# Patient Record
Sex: Female | Born: 1970 | Race: White | Hispanic: No | Marital: Single | Smoking: Never smoker
Health system: Southern US, Community
[De-identification: ages and names within clinical notes are randomized; demographics above are authoritative.]

---

## 2021-03-26 ENCOUNTER — Inpatient Hospital Stay (HOSPITAL_COMMUNITY)
Admission: EM | Admit: 2021-03-26 | Discharge: 2021-04-05 | DRG: 066 | Disposition: A | Discharge status: Home / Self Care | Payer: BLUE CROSS/BLUE SHIELD | Attending: Student in an Organized Health Care Education/Training Program | Admitting: Student in an Organized Health Care Education/Training Program

## 2021-03-26 DIAGNOSIS — G4733 Obstructive sleep apnea (adult) (pediatric): Secondary | ICD-10-CM | POA: Diagnosis present

## 2021-03-26 DIAGNOSIS — R471 Dysarthria and anarthria: Secondary | ICD-10-CM | POA: Diagnosis present

## 2021-03-26 DIAGNOSIS — E876 Hypokalemia: Secondary | ICD-10-CM | POA: Diagnosis present

## 2021-03-26 DIAGNOSIS — R29810 Facial weakness: Secondary | ICD-10-CM | POA: Diagnosis present

## 2021-03-26 DIAGNOSIS — G8321 Monoplegia of upper limb affecting right dominant side: Secondary | ICD-10-CM | POA: Diagnosis present

## 2021-03-26 DIAGNOSIS — R81 Glycosuria: Secondary | ICD-10-CM | POA: Diagnosis present

## 2021-03-26 DIAGNOSIS — Z931 Gastrostomy status: Secondary | ICD-10-CM

## 2021-03-26 DIAGNOSIS — R262 Difficulty in walking, not elsewhere classified: Secondary | ICD-10-CM | POA: Diagnosis present

## 2021-03-26 DIAGNOSIS — R1314 Dysphagia, pharyngoesophageal phase: Secondary | ICD-10-CM | POA: Diagnosis present

## 2021-03-26 DIAGNOSIS — Z20822 Contact with and (suspected) exposure to covid-19: Secondary | ICD-10-CM | POA: Diagnosis present

## 2021-03-26 DIAGNOSIS — R1313 Dysphagia, pharyngeal phase: Secondary | ICD-10-CM | POA: Diagnosis present

## 2021-03-26 DIAGNOSIS — E669 Obesity, unspecified: Secondary | ICD-10-CM | POA: Diagnosis present

## 2021-03-26 DIAGNOSIS — Z6834 Body mass index (BMI) 34.0-34.9, adult: Secondary | ICD-10-CM

## 2021-03-26 DIAGNOSIS — I10 Essential (primary) hypertension: Secondary | ICD-10-CM | POA: Diagnosis present

## 2021-03-26 DIAGNOSIS — R29702 NIHSS score 2: Secondary | ICD-10-CM | POA: Diagnosis present

## 2021-03-26 DIAGNOSIS — R200 Anesthesia of skin: Secondary | ICD-10-CM

## 2021-03-26 DIAGNOSIS — I639 Cerebral infarction, unspecified: Principal | ICD-10-CM | POA: Diagnosis present

## 2021-03-26 DIAGNOSIS — R824 Acetonuria: Secondary | ICD-10-CM | POA: Diagnosis present

## 2021-03-26 DIAGNOSIS — K317 Polyp of stomach and duodenum: Secondary | ICD-10-CM | POA: Diagnosis present

## 2021-03-26 DIAGNOSIS — R600 Localized edema: Secondary | ICD-10-CM | POA: Diagnosis present

## 2021-03-26 DIAGNOSIS — Z4659 Encounter for fitting and adjustment of other gastrointestinal appliance and device: Secondary | ICD-10-CM

## 2021-03-26 NOTE — ED Triage Notes (Signed)
Pt from doctors office for follow up from food poisoning, brought to ED by [facility] for eval of generalized weakness, tingling in R arm & trouble swallowing for over 24hrs. No deficit noted by EMS, pt states "feels different." Ambulatory in triage  EKG WNL 166/105 CBG 101 99% RA

## 2021-03-27 ENCOUNTER — Emergency Department (HOSPITAL_COMMUNITY): Payer: BLUE CROSS/BLUE SHIELD

## 2021-03-27 ENCOUNTER — Inpatient Hospital Stay (HOSPITAL_COMMUNITY): Payer: BLUE CROSS/BLUE SHIELD

## 2021-03-27 DIAGNOSIS — G8321 Monoplegia of upper limb affecting right dominant side: Secondary | ICD-10-CM | POA: Diagnosis present

## 2021-03-27 DIAGNOSIS — Z20822 Contact with and (suspected) exposure to covid-19: Secondary | ICD-10-CM | POA: Diagnosis present

## 2021-03-27 DIAGNOSIS — R29702 NIHSS score 2: Secondary | ICD-10-CM | POA: Diagnosis present

## 2021-03-27 DIAGNOSIS — R1313 Dysphagia, pharyngeal phase: Secondary | ICD-10-CM | POA: Diagnosis present

## 2021-03-27 DIAGNOSIS — E876 Hypokalemia: Secondary | ICD-10-CM | POA: Diagnosis present

## 2021-03-27 DIAGNOSIS — R29810 Facial weakness: Secondary | ICD-10-CM | POA: Diagnosis present

## 2021-03-27 DIAGNOSIS — R81 Glycosuria: Secondary | ICD-10-CM | POA: Diagnosis present

## 2021-03-27 DIAGNOSIS — I639 Cerebral infarction, unspecified: Secondary | ICD-10-CM | POA: Diagnosis present

## 2021-03-27 DIAGNOSIS — Z6834 Body mass index (BMI) 34.0-34.9, adult: Secondary | ICD-10-CM | POA: Diagnosis not present

## 2021-03-27 DIAGNOSIS — I6389 Other cerebral infarction: Secondary | ICD-10-CM

## 2021-03-27 DIAGNOSIS — K317 Polyp of stomach and duodenum: Secondary | ICD-10-CM | POA: Diagnosis present

## 2021-03-27 DIAGNOSIS — E7800 Pure hypercholesterolemia, unspecified: Secondary | ICD-10-CM | POA: Diagnosis not present

## 2021-03-27 DIAGNOSIS — E669 Obesity, unspecified: Secondary | ICD-10-CM | POA: Diagnosis present

## 2021-03-27 DIAGNOSIS — R1314 Dysphagia, pharyngoesophageal phase: Secondary | ICD-10-CM | POA: Diagnosis present

## 2021-03-27 DIAGNOSIS — R600 Localized edema: Secondary | ICD-10-CM | POA: Diagnosis present

## 2021-03-27 DIAGNOSIS — R471 Dysarthria and anarthria: Secondary | ICD-10-CM

## 2021-03-27 DIAGNOSIS — R262 Difficulty in walking, not elsewhere classified: Secondary | ICD-10-CM | POA: Diagnosis present

## 2021-03-27 DIAGNOSIS — G4733 Obstructive sleep apnea (adult) (pediatric): Secondary | ICD-10-CM | POA: Diagnosis present

## 2021-03-27 DIAGNOSIS — I10 Essential (primary) hypertension: Secondary | ICD-10-CM | POA: Diagnosis present

## 2021-03-27 DIAGNOSIS — R824 Acetonuria: Secondary | ICD-10-CM | POA: Diagnosis present

## 2021-03-27 DIAGNOSIS — Z931 Gastrostomy status: Secondary | ICD-10-CM | POA: Diagnosis not present

## 2021-03-27 LAB — ECHOCARDIOGRAM COMPLETE
Area-P 1/2: 2.65 cm2
Height: 64 in
S' Lateral: 1.8 cm
Weight: 3248 oz

## 2021-03-27 LAB — CBC
HCT: 45.3 % (ref 36.0–46.0)
Hemoglobin: 14.3 g/dL (ref 12.0–15.0)
MCH: 26.2 pg (ref 26.0–34.0)
MCHC: 31.6 g/dL (ref 30.0–36.0)
MCV: 83 fL (ref 80.0–100.0)
Platelets: 329 10*3/uL (ref 150–400)
RBC: 5.46 MIL/uL — ABNORMAL HIGH (ref 3.87–5.11)
RDW: 14.7 % (ref 11.5–15.5)
WBC: 10.5 10*3/uL (ref 4.0–10.5)
nRBC: 0 % (ref 0.0–0.2)

## 2021-03-27 LAB — DIFFERENTIAL
Abs Immature Granulocytes: 0.02 10*3/uL (ref 0.00–0.07)
Basophils Absolute: 0.1 10*3/uL (ref 0.0–0.1)
Basophils Relative: 1 %
Eosinophils Absolute: 0 10*3/uL (ref 0.0–0.5)
Eosinophils Relative: 0 %
Immature Granulocytes: 0 %
Lymphocytes Relative: 21 %
Lymphs Abs: 2.2 10*3/uL (ref 0.7–4.0)
Monocytes Absolute: 0.7 10*3/uL (ref 0.1–1.0)
Monocytes Relative: 6 %
Neutro Abs: 7.5 10*3/uL (ref 1.7–7.7)
Neutrophils Relative %: 72 %

## 2021-03-27 LAB — I-STAT CHEM 8, ED
BUN: 22 mg/dL — ABNORMAL HIGH (ref 6–20)
Calcium, Ion: 1.18 mmol/L (ref 1.15–1.40)
Chloride: 103 mmol/L (ref 98–111)
Creatinine, Ser: 0.8 mg/dL (ref 0.44–1.00)
Glucose, Bld: 97 mg/dL (ref 70–99)
HCT: 46 % (ref 36.0–46.0)
Hemoglobin: 15.6 g/dL — ABNORMAL HIGH (ref 12.0–15.0)
Potassium: 3.2 mmol/L — ABNORMAL LOW (ref 3.5–5.1)
Sodium: 142 mmol/L (ref 135–145)
TCO2: 28 mmol/L (ref 22–32)

## 2021-03-27 LAB — URINALYSIS, ROUTINE W REFLEX MICROSCOPIC
Bilirubin Urine: NEGATIVE
Glucose, UA: 50 mg/dL — AB
Hgb urine dipstick: NEGATIVE
Ketones, ur: 20 mg/dL — AB
Leukocytes,Ua: NEGATIVE
Nitrite: NEGATIVE
Protein, ur: NEGATIVE mg/dL
Specific Gravity, Urine: 1.03 (ref 1.005–1.030)
pH: 6 (ref 5.0–8.0)

## 2021-03-27 LAB — COMPREHENSIVE METABOLIC PANEL
ALT: 20 U/L (ref 0–44)
AST: 26 U/L (ref 15–41)
Albumin: 4.6 g/dL (ref 3.5–5.0)
Alkaline Phosphatase: 75 U/L (ref 38–126)
Anion gap: 10 (ref 5–15)
BUN: 18 mg/dL (ref 6–20)
CO2: 26 mmol/L (ref 22–32)
Calcium: 9.4 mg/dL (ref 8.9–10.3)
Chloride: 103 mmol/L (ref 98–111)
Creatinine, Ser: 0.92 mg/dL (ref 0.44–1.00)
GFR, Estimated: >60 mL/min (ref >60)
Glucose, Bld: 97 mg/dL (ref 70–99)
Potassium: 3.2 mmol/L — ABNORMAL LOW (ref 3.5–5.1)
Sodium: 139 mmol/L (ref 135–145)
Total Bilirubin: 1.2 mg/dL (ref 0.3–1.2)
Total Protein: 7.8 g/dL (ref 6.5–8.1)

## 2021-03-27 LAB — ETHANOL: Alcohol, Ethyl (B): <10 mg/dL (ref <10)

## 2021-03-27 LAB — I-STAT BETA HCG BLOOD, ED (MC, WL, AP ONLY): I-stat hCG, quantitative: <5 m[IU]/mL (ref <5)

## 2021-03-27 LAB — TSH: TSH: 1.073 u[IU]/mL (ref 0.350–4.500)

## 2021-03-27 LAB — TROPONIN I (HIGH SENSITIVITY)
Troponin I (High Sensitivity): 10 ng/L (ref <18)
Troponin I (High Sensitivity): 9 ng/L (ref <18)

## 2021-03-27 LAB — PROTIME-INR
INR: 1 (ref 0.8–1.2)
Prothrombin Time: 13.5 seconds (ref 11.4–15.2)

## 2021-03-27 LAB — APTT: aPTT: 34 seconds (ref 24–36)

## 2021-03-27 LAB — SARS CORONAVIRUS 2 (TAT 6-24 HRS): SARS Coronavirus 2: NEGATIVE

## 2021-03-27 LAB — HIV ANTIBODY (ROUTINE TESTING W REFLEX): HIV Screen 4th Generation wRfx: NONREACTIVE

## 2021-03-27 IMAGING — MR MR HEAD WO/W CM
6 of 12 series · 26 of 48 positions shown · IV contrast (gadavist)
Comparison: None.

CLINICAL DATA: Right-sided tingling, difficulty speaking, abnormal
vision

EXAM:
MRI HEAD WITHOUT AND WITH CONTRAST
TECHNIQUE: Multiplanar, multiecho pulse sequences of the brain and surrounding
structures were obtained without and with intravenous contrast.
CONTRAST:  9mL GADAVIST GADOBUTROL 1 MMOL/ML IV SOLN

[Series 2: DWI · axial · 3.0mm · 0.94mm/px · z∈[-63,+82]mm · 8 of 99 slices shown (1 of 2)]
[im 1/99]
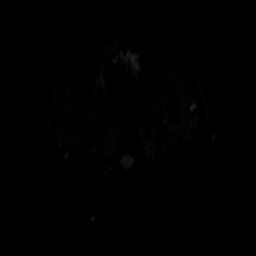
[im 15/99]
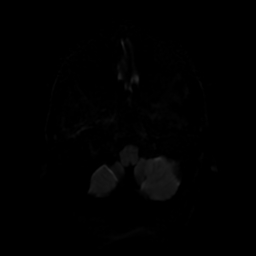
[im 29/99]
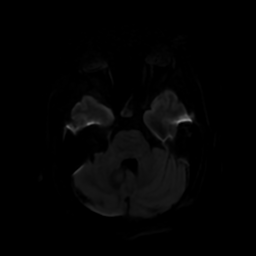
[im 43/99]
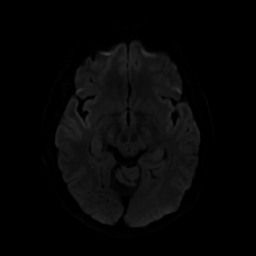
[im 57/99]
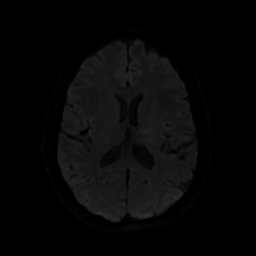
[im 71/99]
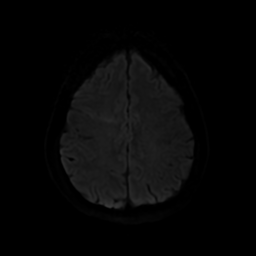
[im 85/99]
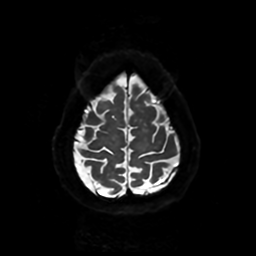
[im 99/99]
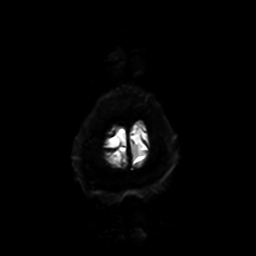

[Series 3: DWI · coronal · 4.0mm · 0.94mm/px · 6 of 74 slices shown (2 of 2)]
[im 1/74]
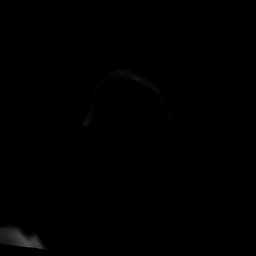
[im 15/74]
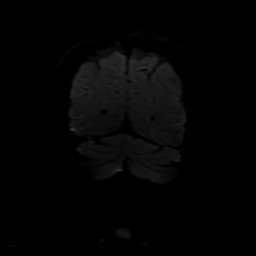
[im 30/74]
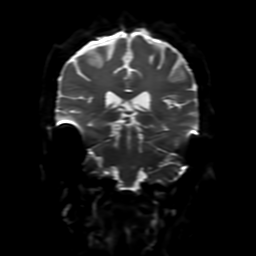
[im 44/74]
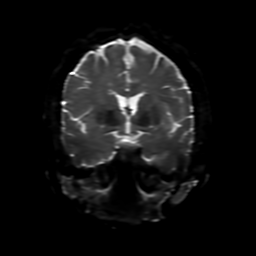
[im 59/74]
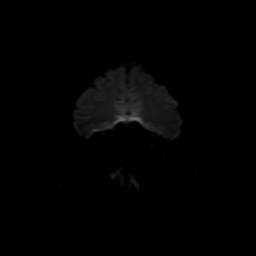
[im 74/74]
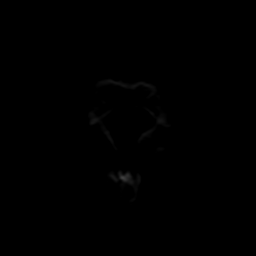

[Series 4: FLAIR · sagittal · 5.0mm · 0.23mm/px · 2 of 23 slices shown (1 of 2)]
[im 1/23]
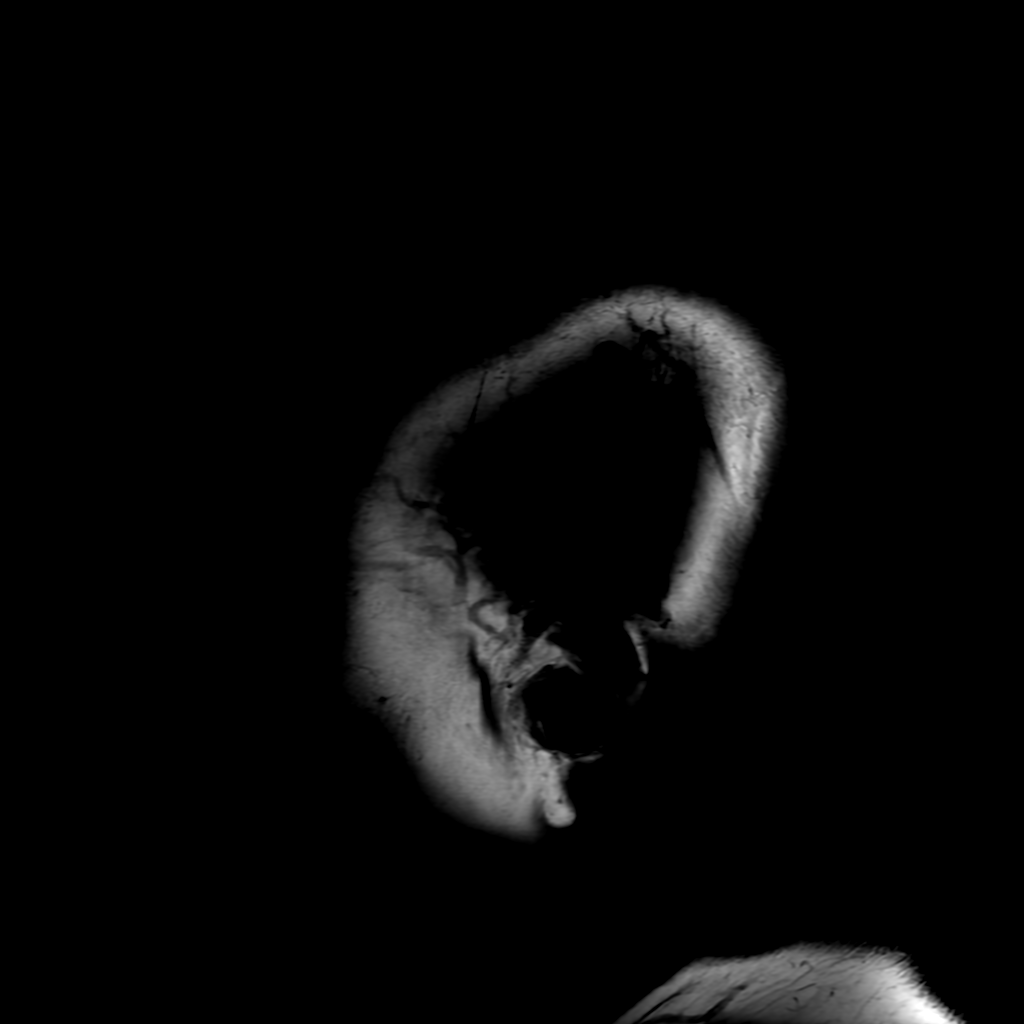
[im 23/23]
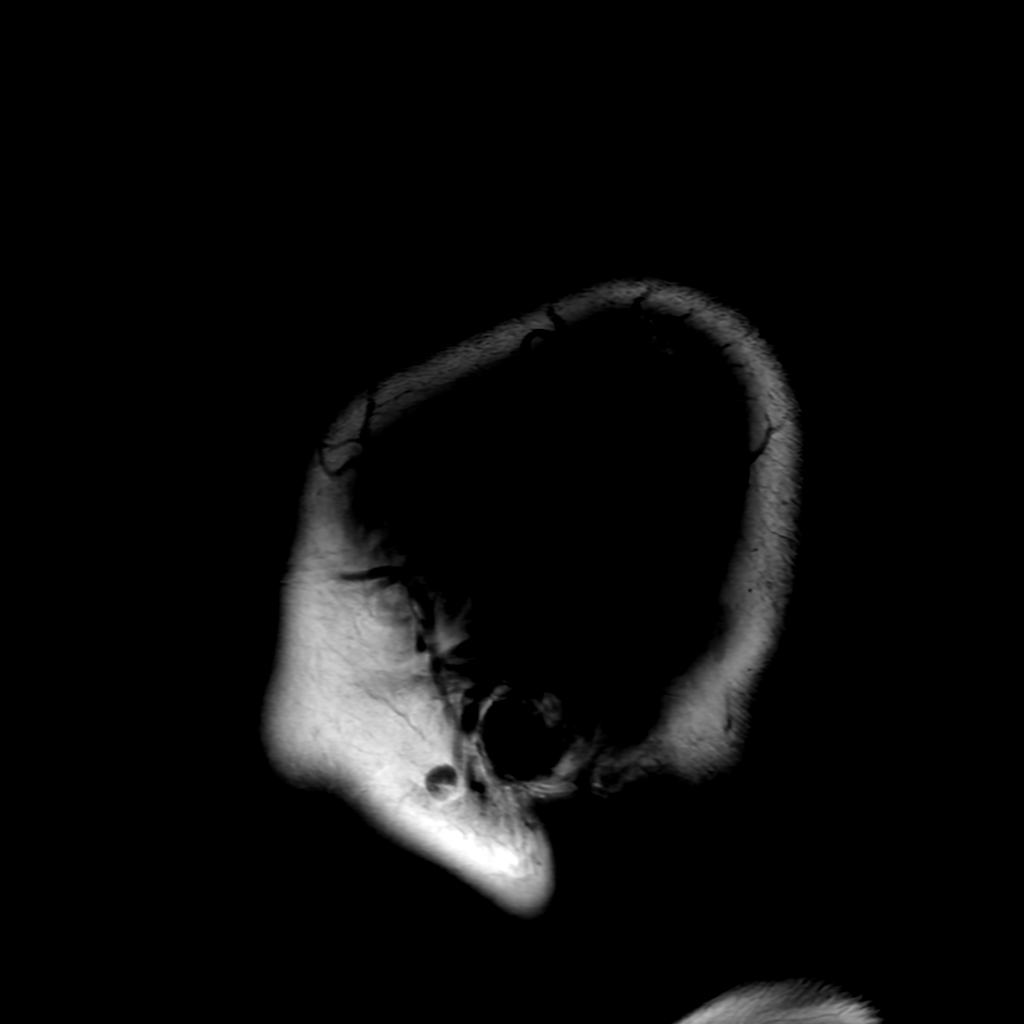

[Series 6: FLAIR · axial · 4.0mm · 0.45mm/px · z∈[-56,+83]mm · 3 of 33 slices shown (2 of 2)]
[im 1/33]
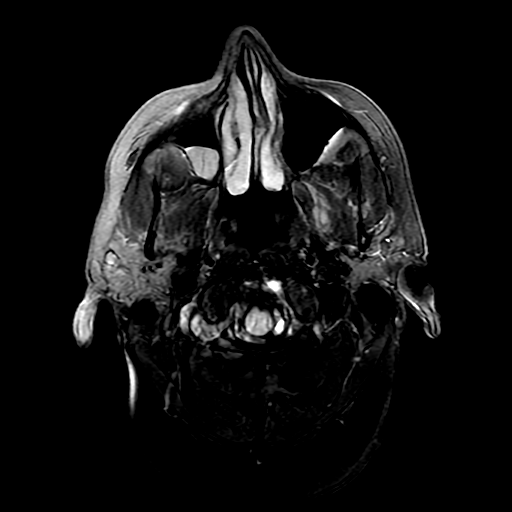
[im 17/33]
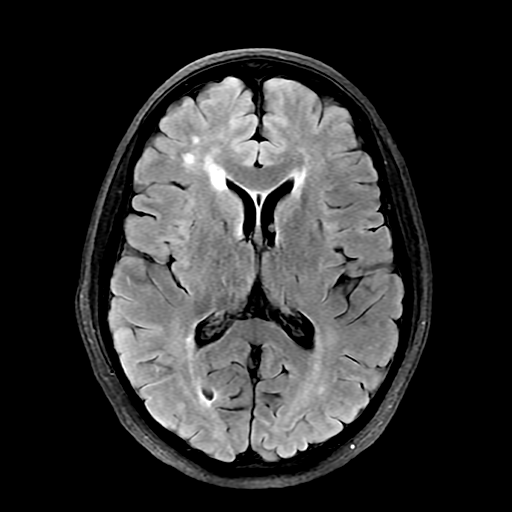
[im 33/33]
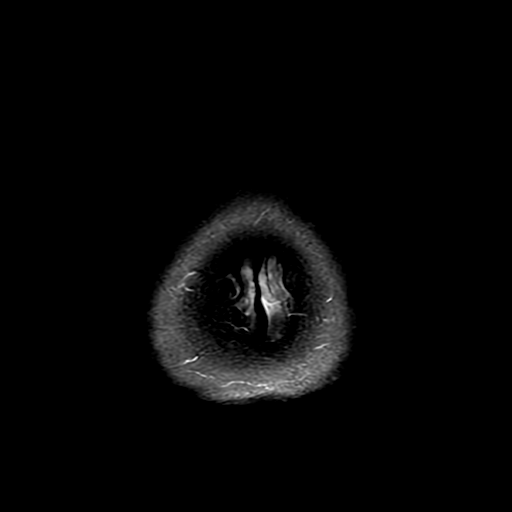

[Series 250: ADC · axial · 3.0mm · 0.94mm/px · z∈[-63,+82]mm · 4 of 50 slices shown (1 of 2)]
[im 1/50]
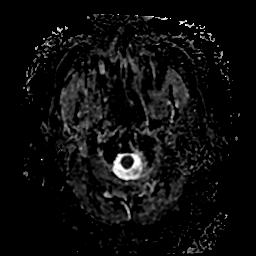
[im 17/50]
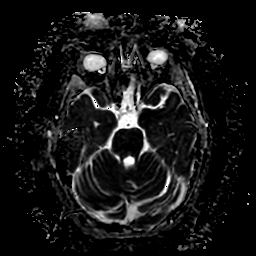
[im 33/50]
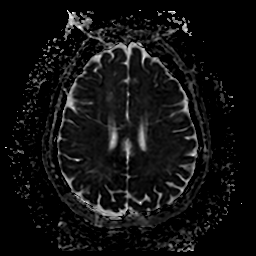
[im 50/50]
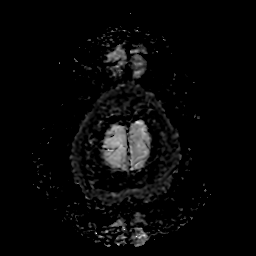

[Series 350: ADC · coronal · 4.0mm · 0.94mm/px · 3 of 37 slices shown (2 of 2)]
[im 1/37]
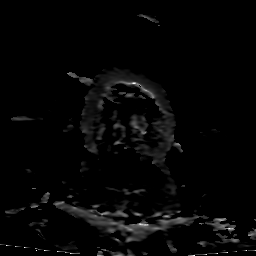
[im 19/37]
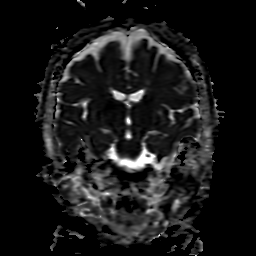
[im 37/37]
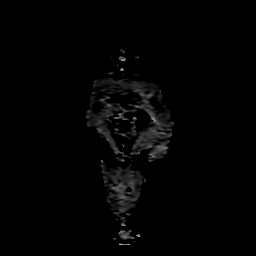

[26 of 48 positions shown; findings below may reference images not displayed]

FINDINGS: Brain: There is a subcentimeter area of mildly reduced diffusion at
the left aspect of the pontomedullary junction. There is no acute
infarction or intracranial hemorrhage. There is no intracranial
mass, mass effect, or edema. There is no hydrocephalus or
extra-axial fluid collection. Ventricles and sulci are normal in
size and configuration. Patchy T2 hyperintensity in the
supratentorial white matter is nonspecific but may reflect mild to
moderate chronic microvascular ischemic changes. No abnormal
enhancement.

Vascular: Major vessel flow voids at the skull base are preserved.

Skull and upper cervical spine: Normal marrow signal is preserved.

Sinuses/Orbits: Minor mucosal thickening.  Orbits are unremarkable.

Other: Sella is unremarkable.  Mastoid air cells are clear.
IMPRESSION: Small acute infarct at the left pontomedullary junction.

Mild to moderate chronic microvascular ischemic changes.

No mass or abnormal enhancement.

## 2021-03-27 IMAGING — MR MR MRA HEAD W/O CM
4 series · 18 of 48 positions shown · non-contrast
Comparison: No pertinent prior exam.

CLINICAL DATA: Weakness and right arm tingling.  Speech difficulty.

EXAM:
MRA HEAD WITHOUT CONTRAST
TECHNIQUE: Angiographic images of the Circle of Willis were acquired using MRA
technique without intravenous contrast.

[Series 2: ax 3dtof · axial · 1.0mm · 0.43mm/px · z∈[-63,+16]mm · 11 of 173 slices shown]
[im 9/173]
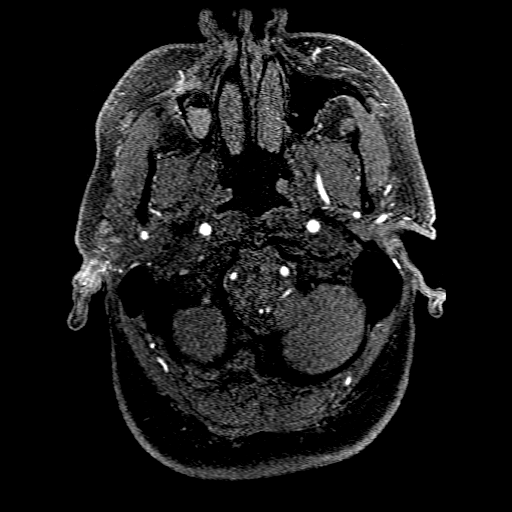
[im 26/173]
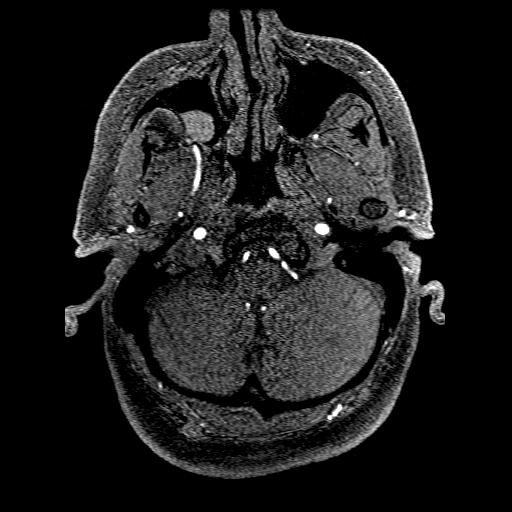
[im 31/173]
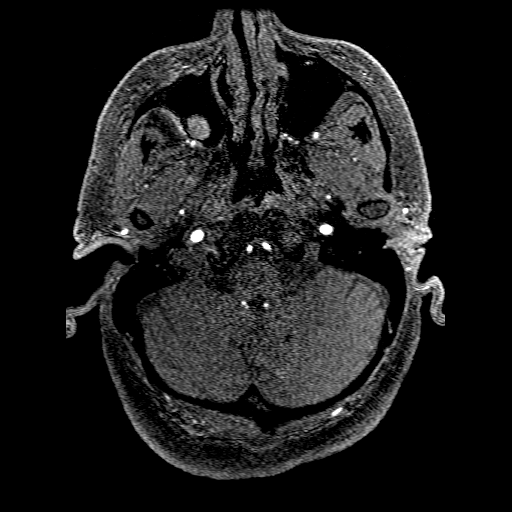
[im 52/173]
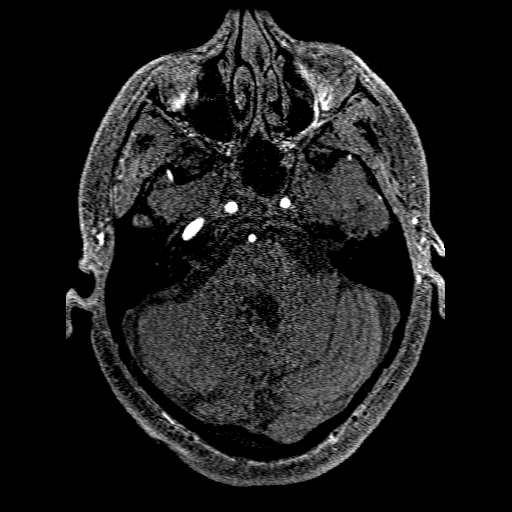
[im 74/173]
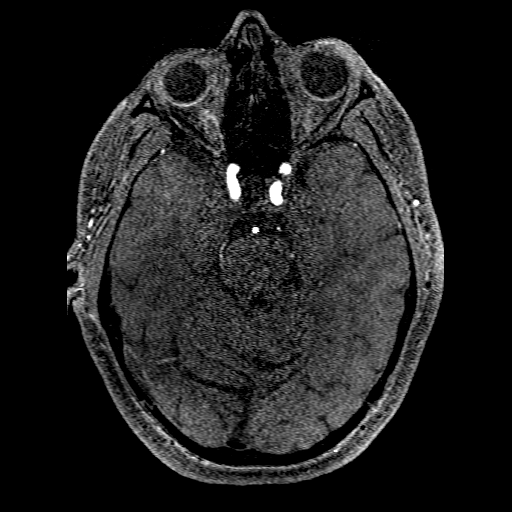
[im 87/173]
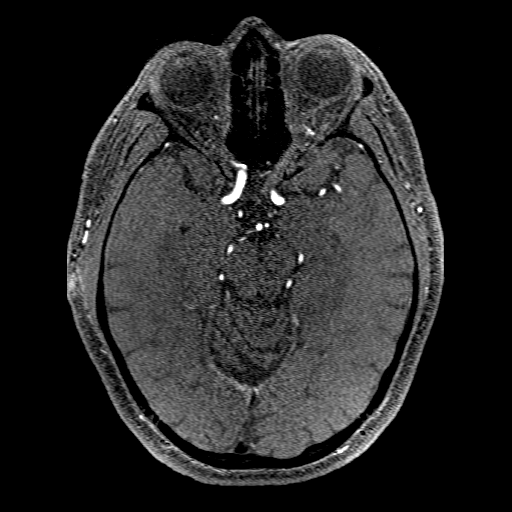
[im 99/173]
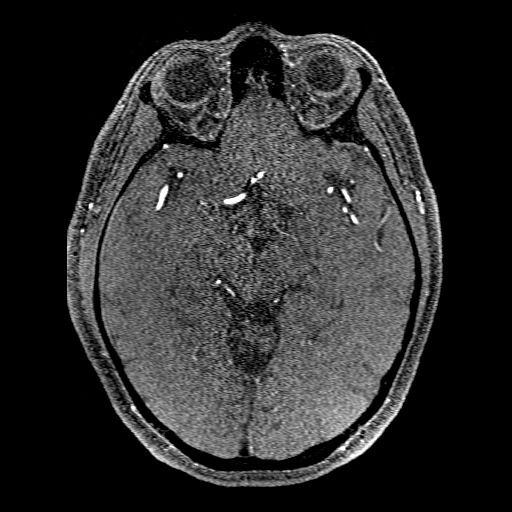
[im 121/173]
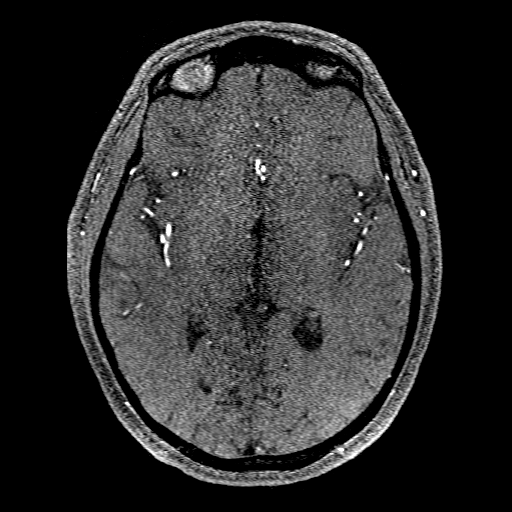
[im 142/173]
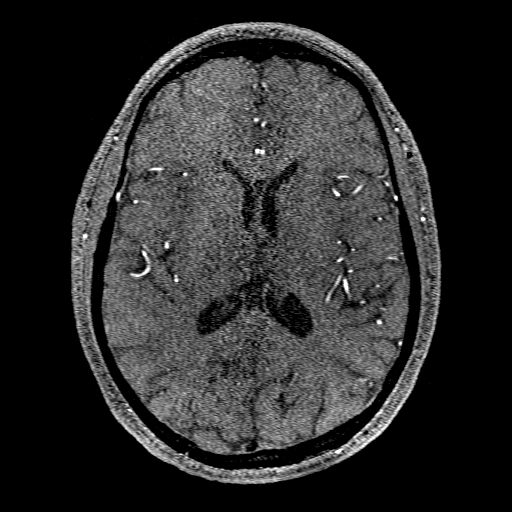
[im 147/173]
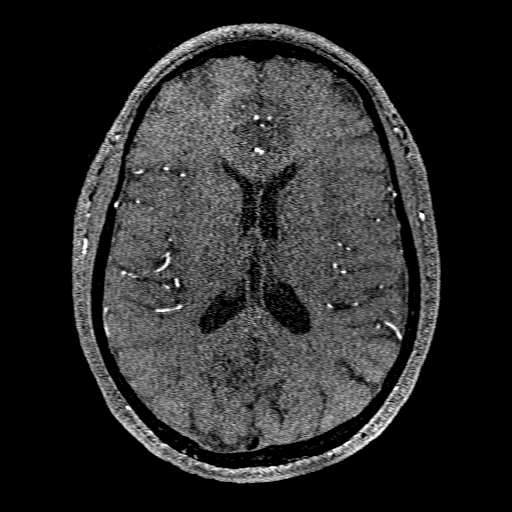
[im 164/173]
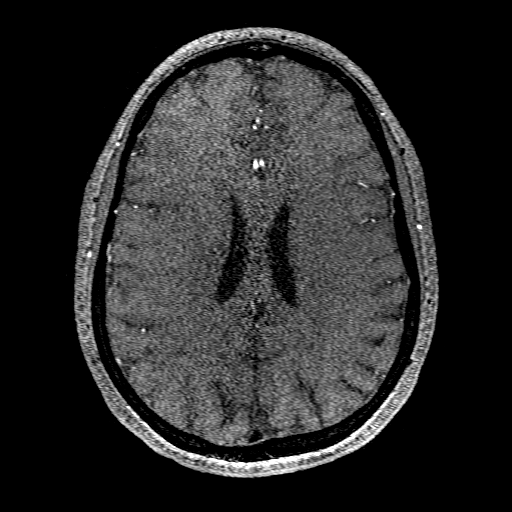

[Series 201: pjn:ax 3dtof · sagittal · 1.0mm · 0.43mm/px · 1 of 3 slices shown]
[im 1/3]
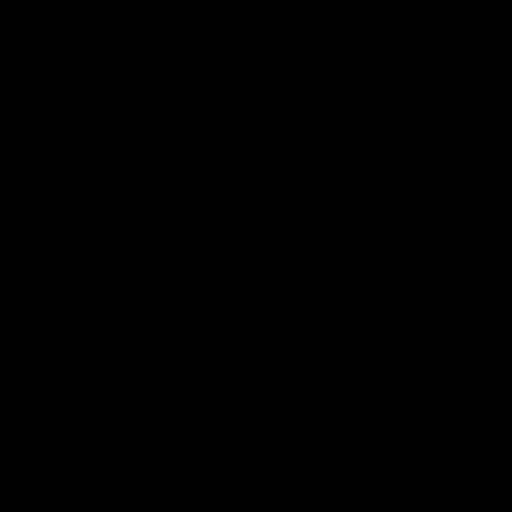

[Series 250: projection images · coronal · 1.0mm · 0.39mm/px · 3 of 13 slices shown (1 of 2)]
[im 1/13]
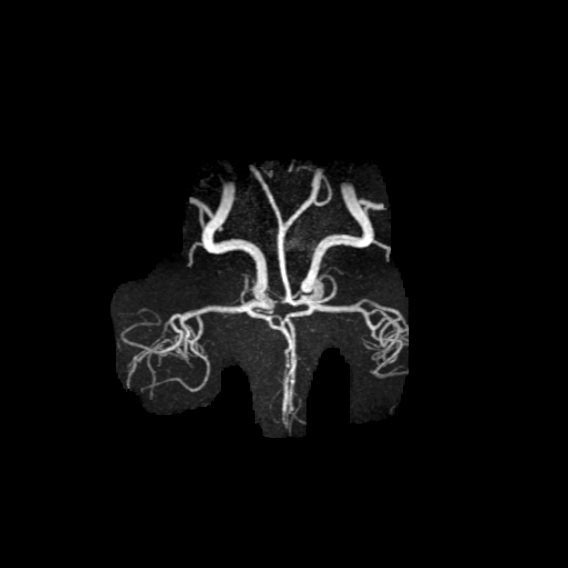
[im 7/13]
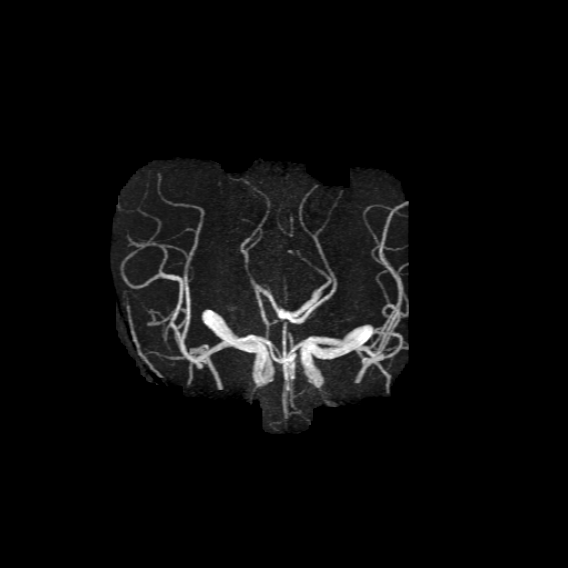
[im 13/13]
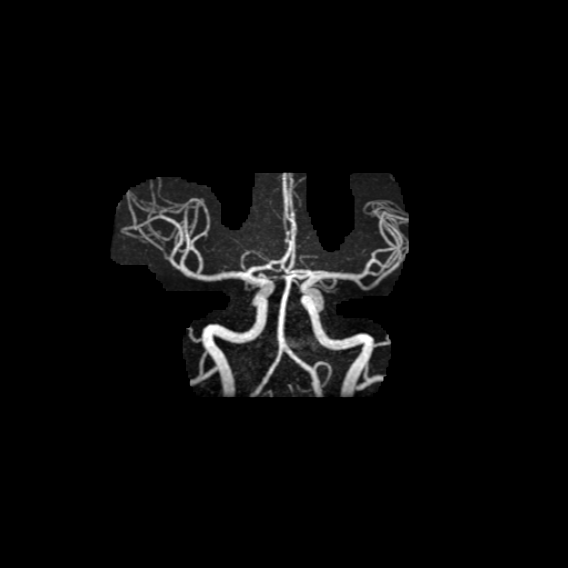

[Series 251: projection images · oblique · 1.0mm · 0.39mm/px · 3 of 13 slices shown (2 of 2)]
[im 1/13]
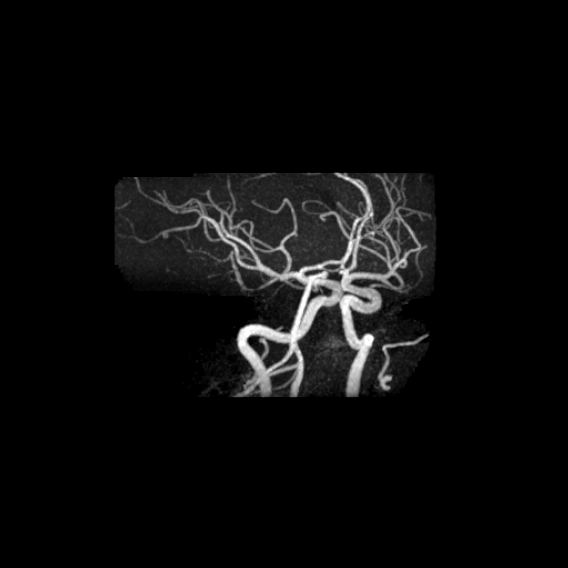
[im 7/13]
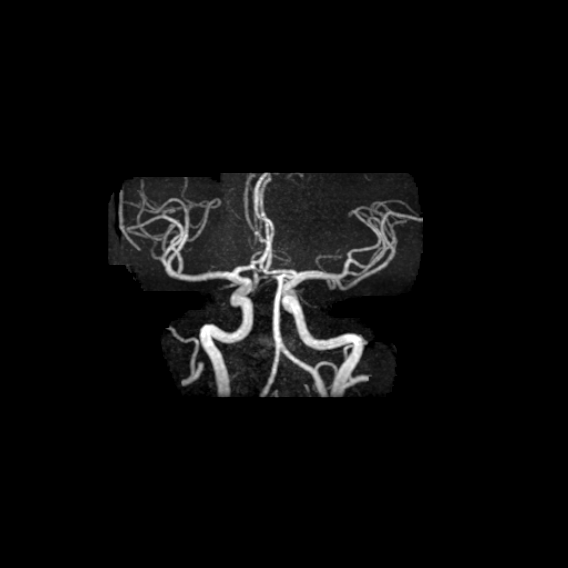
[im 13/13]
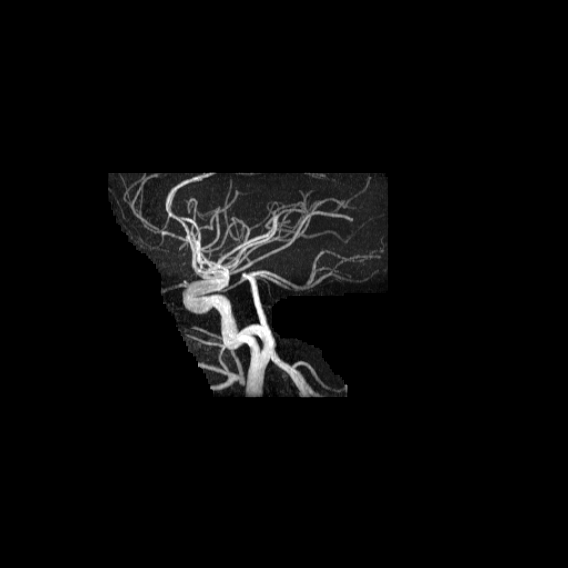

[18 of 48 positions shown; findings below may reference images not displayed]

FINDINGS: POSTERIOR CIRCULATION:

--Vertebral arteries: Normal

--Inferior cerebellar arteries: Normal.

--Basilar artery: Normal.

--Superior cerebellar arteries: Normal.

--Posterior cerebral arteries: Normal.

ANTERIOR CIRCULATION:

--Intracranial internal carotid arteries: Normal.

--Anterior cerebral arteries (ACA): Normal.

--Middle cerebral arteries (MCA): Normal.

ANATOMIC VARIANTS: None

Other: None.
IMPRESSION: Normal intracranial MRA.

## 2021-03-27 IMAGING — CT CT HEAD W/O CM
4 of 5 series · 16 of 47 positions shown, 18 images · non-contrast
Comparison: None.

CLINICAL DATA: 49-year-old female with generalized weakness.
Trouble swallowing for 24 hours. Right arm tingling and weakness.

EXAM:
CT HEAD WITHOUT CONTRAST
TECHNIQUE: Contiguous axial images were obtained from the base of the skull
through the vertex without intravenous contrast.

[Series 3: head wo · axial · 0.43mm/px · z∈[-120,-20]mm · 5 of 32 slices shown, 7 images (1 of 2)]
[im 6/32  brain]
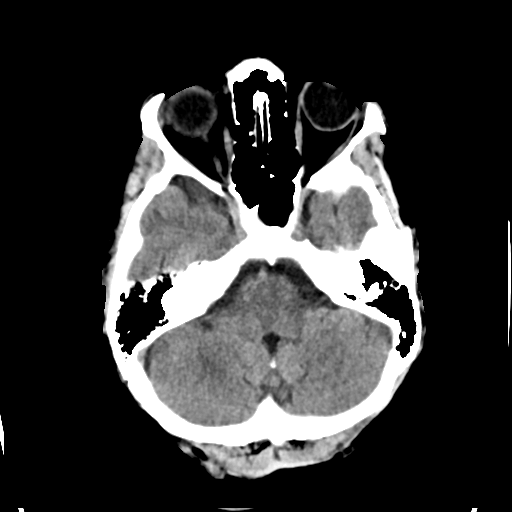
[im 6/32  bone]
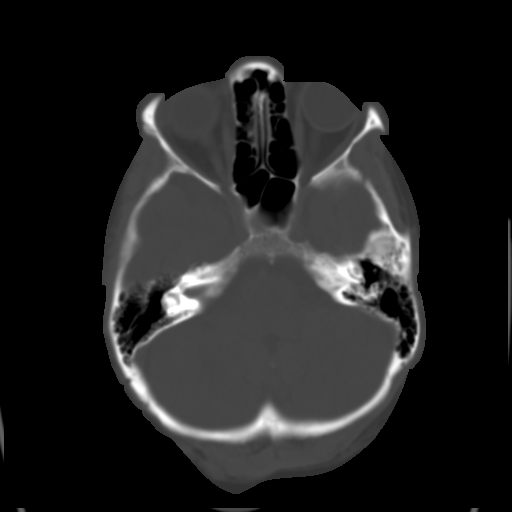
[im 11/32  brain]
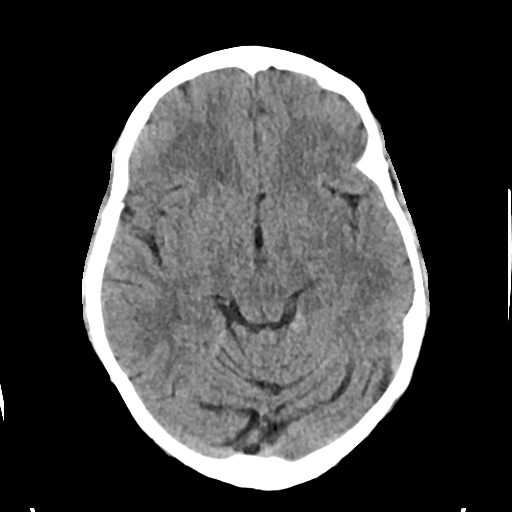
[im 16/32  brain]
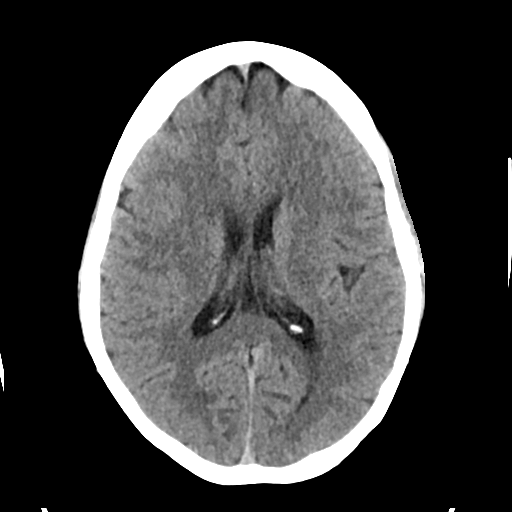
[im 21/32  brain]
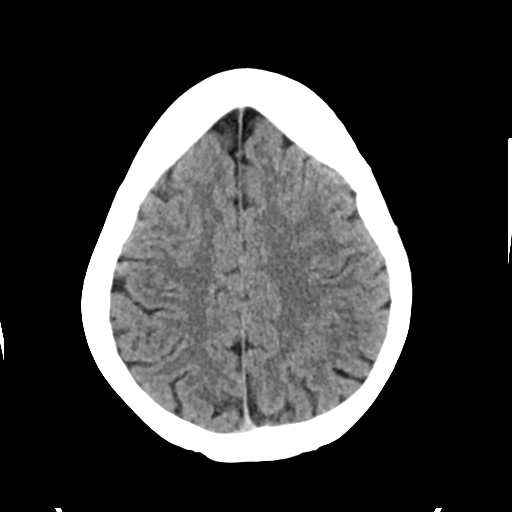
[im 26/32  brain]
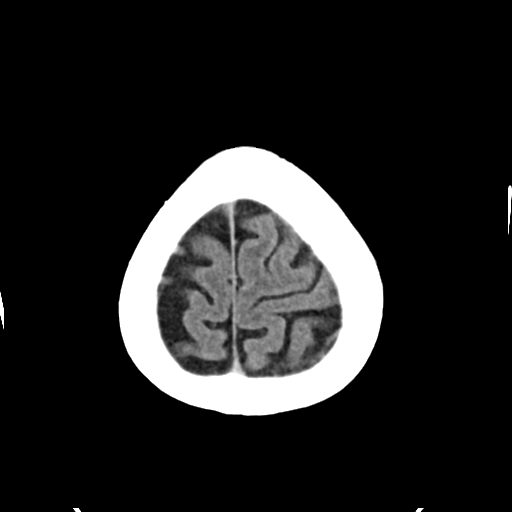
[im 26/32  bone]
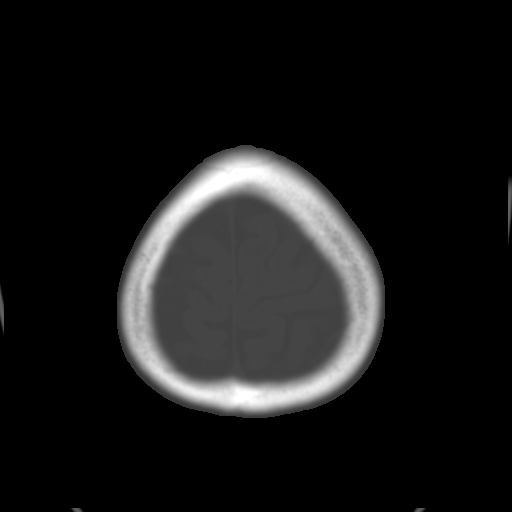

[Series 5: cor soft · coronal · 0.31mm/px · 3 of 68 slices shown]
[im 23/68  brain]
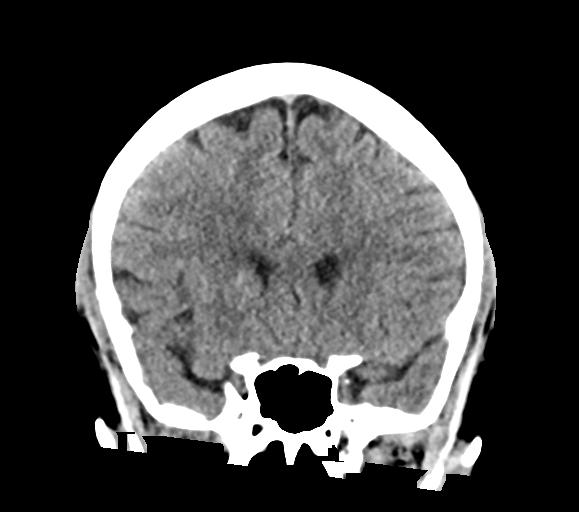
[im 30/68  brain]
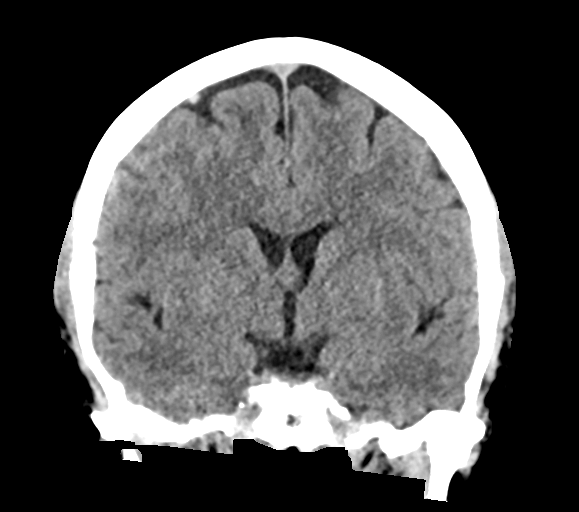
[im 38/68  brain]
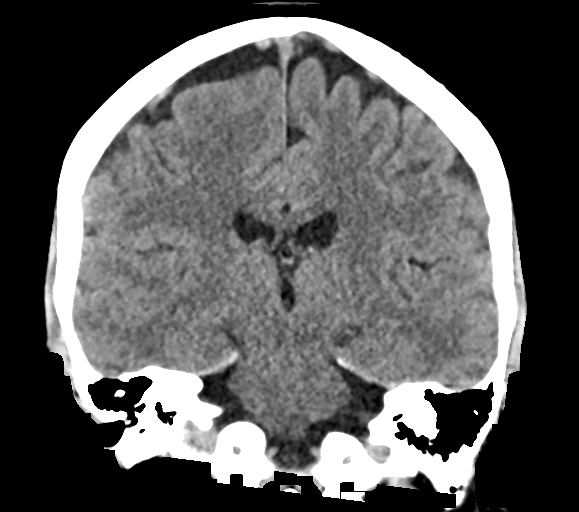

[Series 6: sag soft · sagittal · 0.31mm/px · 3 of 60 slices shown]
[im 21/60  brain]
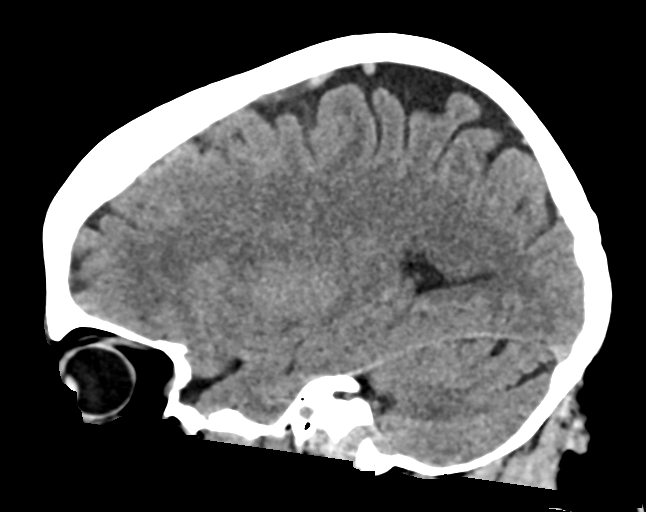
[im 30/60  brain]
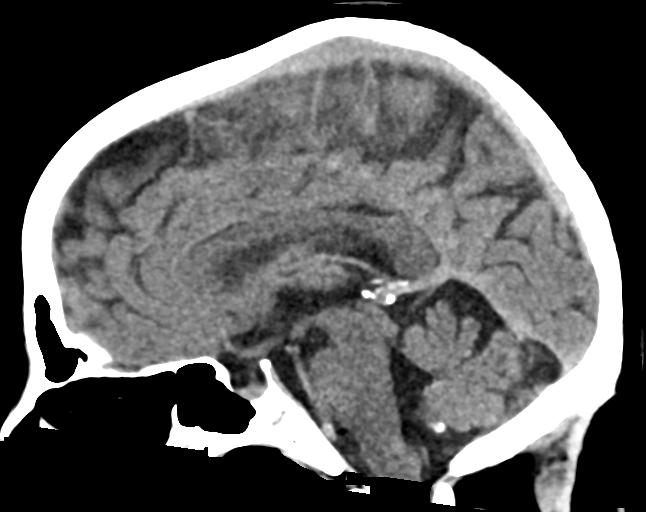
[im 38/60  brain]
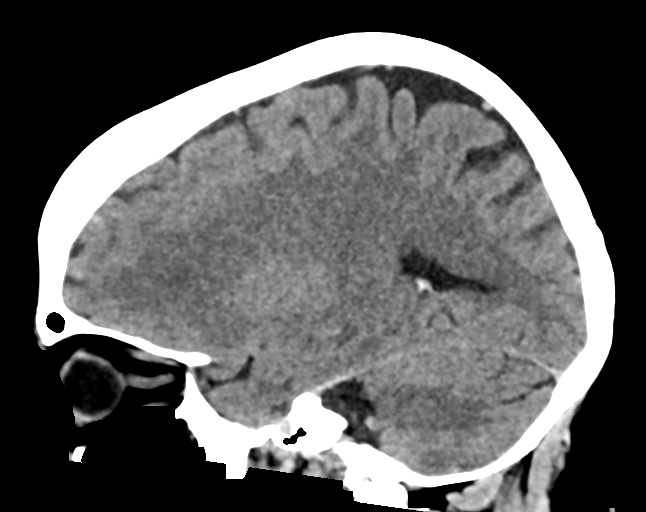

[Series 7: head wo · axial · 0.36mm/px · z∈[-153,-50]mm · 5 of 33 slices shown (2 of 2)]
[im 6/33  brain]
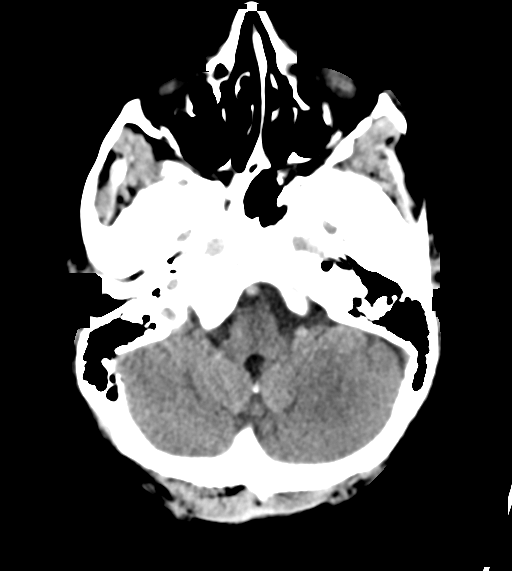
[im 11/33  brain]
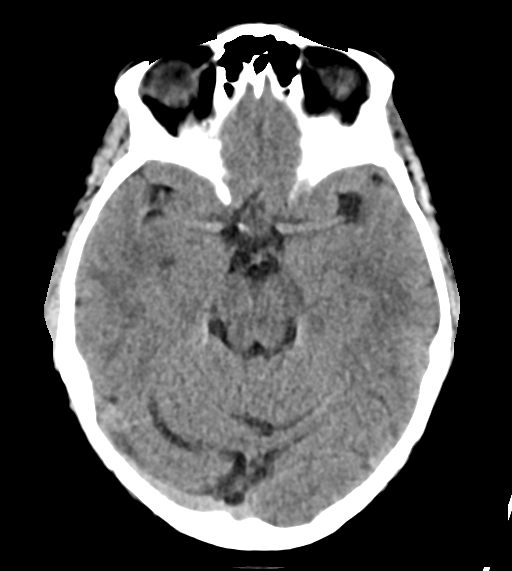
[im 17/33  brain]
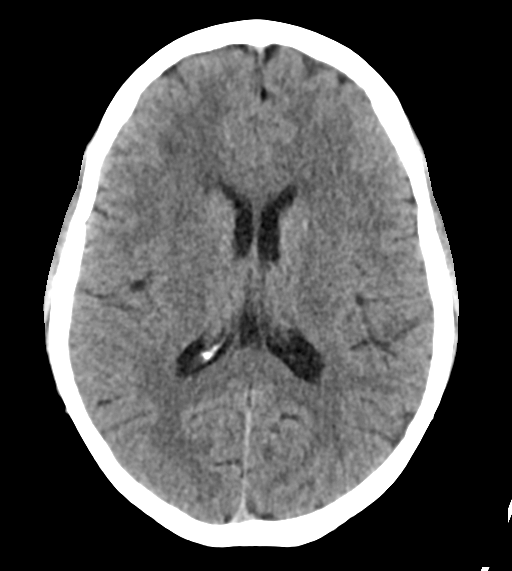
[im 22/33  brain]
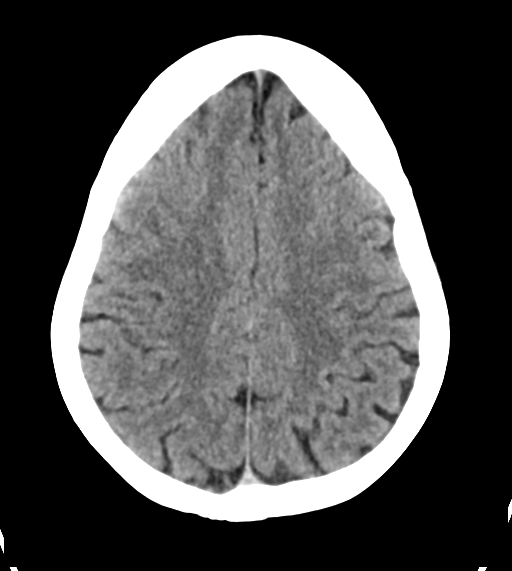
[im 27/33  brain]
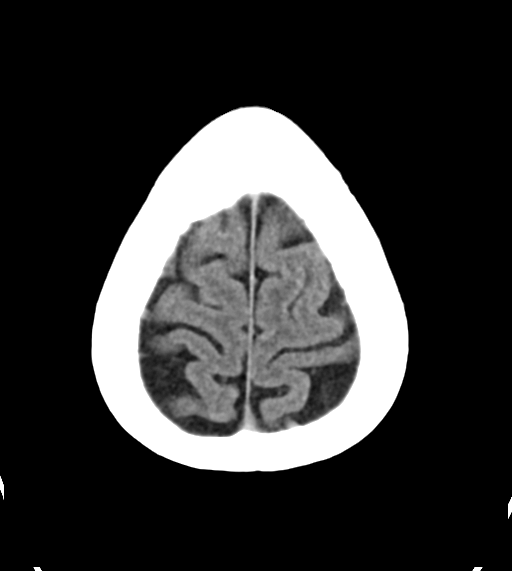

[16 of 47 positions shown; findings below may reference images not displayed]

FINDINGS: Brain: No midline shift, ventriculomegaly, mass effect, evidence of
mass lesion, intracranial hemorrhage or evidence of cortically based
acute infarction. Gray-white matter differentiation is within normal
limits throughout the brain. No encephalomalacia identified. Small
volume subarachnoid CSF over both convexities.

Vascular: Mild Calcified atherosclerosis at the skull base. No
suspicious intracranial vascular hyperdensity.

Skull: Negative.

Sinuses/Orbits: Visualized paranasal sinuses and mastoids are clear.

Other: Visualized orbits and scalp soft tissues are within normal
limits.
IMPRESSION: Negative noncontrast CT appearance of the brain.

## 2021-03-27 MED ORDER — SODIUM CHLORIDE 0.9 % IV SOLN: INTRAVENOUS | Status: AC

## 2021-03-27 MED ORDER — POTASSIUM CHLORIDE CRYS ER 20 MEQ PO TBCR: 40.0000 meq | EXTENDED_RELEASE_TABLET | Freq: Once | ORAL | Status: DC

## 2021-03-27 MED ORDER — ATORVASTATIN CALCIUM 40 MG PO TABS: 40.0000 mg | ORAL_TABLET | Freq: Every day | ORAL | Status: DC

## 2021-03-27 MED ORDER — SENNOSIDES-DOCUSATE SODIUM 8.6-50 MG PO TABS: 1.0000 | ORAL_TABLET | Freq: Every evening | ORAL | Status: DC | PRN

## 2021-03-27 MED ORDER — ASPIRIN EC 81 MG PO TBEC: 81.0000 mg | DELAYED_RELEASE_TABLET | Freq: Every day | ORAL | Status: DC

## 2021-03-27 MED ORDER — ACETAMINOPHEN 325 MG PO TABS: 650.0000 mg | ORAL_TABLET | ORAL | Status: DC | PRN

## 2021-03-27 MED ORDER — GADOBUTROL 1 MMOL/ML IV SOLN
9.0000 mL | Freq: Once | INTRAVENOUS | Status: AC | PRN
  Administered 2021-03-27: 9 mL via INTRAVENOUS

## 2021-03-27 MED ORDER — ACETAMINOPHEN 650 MG RE SUPP: 650.0000 mg | RECTAL | Status: DC | PRN

## 2021-03-27 MED ORDER — ENOXAPARIN SODIUM 40 MG/0.4ML IJ SOSY
40.0000 mg | PREFILLED_SYRINGE | INTRAMUSCULAR | Status: DC
  Administered 2021-03-27: 40 mg via SUBCUTANEOUS
  Filled 2021-03-27 (×2): qty 0.4

## 2021-03-27 MED ORDER — ACETAMINOPHEN 160 MG/5ML PO SOLN
650.0000 mg | ORAL | Status: DC | PRN
Start: 2021-03-27 — End: 2021-04-05
  Administered 2021-04-03: 650 mg
  Filled 2021-03-27: qty 20.3

## 2021-03-27 MED ORDER — STROKE: EARLY STAGES OF RECOVERY BOOK: Freq: Once | Status: DC

## 2021-03-27 MED ORDER — ASPIRIN 325 MG PO TABS: 325.0000 mg | ORAL_TABLET | Freq: Once | ORAL | Status: DC

## 2021-03-27 NOTE — ED Provider Notes (Signed)
[facility] EMERGENCY DEPARTMENT Provider Note   CSN: [id number] Arrival date & time: 03/26/21  1540     History Chief Complaint  Patient presents with   Weakness    Tanya Nichols is a 50 y.o. female.  Patient with no regular medical history here with generalized weakness, right-sided tingling, difficulty speaking, difficulty swallowing, dizziness and problems with her vision.  Symptoms have been ongoing since 10 AM June 15 with her last seen normal.  States she feels like there is "something off with my vision" and her vision is slanted.  She thought she could have food poisoning as she had multiple episodes of nausea and vomiting but no diarrhea.  Has had some dizziness and lightheadedness and difficulty walking.  Yesterday she developed right-sided numbness involving her face, arm and leg.  No weakness.  Feels like her mouth is not moving properly when she tries to get her words out.  Having difficulty swallowing and feels like she cannot swallow completely.  Feels like something is not going down when she tries to swallow.  She denies any weakness in her arms or legs.  She denies any chest pain or shortness of breath.  She denies any abdominal pain.  She denies any fever. She was seen in urgent care and referred to the ED with concern for stroke She has waited over 14 hours in the waiting room  The history is provided by the patient.  Weakness Associated symptoms: dizziness, nausea and vomiting   Associated symptoms: no abdominal pain, no arthralgias, no chest pain, no cough, no dysuria, no fever, no myalgias and no shortness of breath       No past medical history on file.  There are no problems to display for this patient.   * The histories are not reviewed yet. Please review them in the "History" navigator section and refresh this SmartLink.   OB History   No obstetric history on file.     No family history on file.     Home Medications Prior to  Admission medications   Not on File    Allergies    Patient has no allergy information on record.  Review of Systems   Review of Systems  Constitutional:  Negative for activity change, appetite change and fever.  HENT:  Negative for congestion and rhinorrhea.   Eyes:  Positive for visual disturbance.  Respiratory:  Negative for cough, chest tightness and shortness of breath.   Cardiovascular:  Negative for chest pain and leg swelling.  Gastrointestinal:  Positive for nausea and vomiting. Negative for abdominal pain.  Genitourinary:  Negative for dysuria and hematuria.  Musculoskeletal:  Negative for arthralgias and myalgias.  Skin:  Negative for rash.  Neurological:  Positive for dizziness, weakness, light-headedness and numbness.   all other systems are negative except as noted in the HPI and PMH.   Physical Exam Updated Vital Signs BP (!) 160/90   Pulse 75   Temp 98.7 F (37.1 C)   Resp 17   Ht 5\' 4"  (1.626 m)   Wt 92.1 kg   SpO2 100%   BMI 34.84 kg/m   Physical Exam Vitals and nursing note reviewed.  Constitutional:      General: She is not in acute distress.    Appearance: She is well-developed.  HENT:     Head: Normocephalic and atraumatic.     Mouth/Throat:     Pharynx: No oropharyngeal exudate.  Eyes:     Conjunctiva/sclera: Conjunctivae normal.  Pupils: Pupils are equal, round, and reactive to light.  Neck:     Comments: No meningismus. Cardiovascular:     Rate and Rhythm: Normal rate and regular rhythm.     Heart sounds: Normal heart sounds. No murmur heard. Pulmonary:     Effort: Pulmonary effort is normal. No respiratory distress.     Breath sounds: Normal breath sounds.  Chest:     Chest wall: No tenderness.  Abdominal:     Palpations: Abdomen is soft.     Tenderness: There is no abdominal tenderness. There is no guarding or rebound.  Musculoskeletal:        General: No tenderness. Normal range of motion.     Cervical back: Normal range of  motion and neck supple.     Comments: 5/5 strength in bilateral lower extremities. Ankle plantar and dorsiflexion intact. Great toe extension intact bilaterally. +2 DP and PT pulses. +2 patellar reflexes bilaterally. Normal gait.   Skin:    General: Skin is warm.  Neurological:     Mental Status: She is alert and oriented to person, place, and time.     Cranial Nerves: No cranial nerve deficit.     Motor: No abnormal muscle tone.     Coordination: Coordination normal.     Comments: No appreciable facial droop.  Tongue is midline.  Eyebrow raises equal. No ataxia on finger-to-nose.  5/5 strength throughout.  No pronator drift.  Subjective decreased sensation involving right face, arm and leg.  5/5 strength throughout. CN 2-12 intact.Equal grip strength.   Psychiatric:        Behavior: Behavior normal.    ED Results / Procedures / Treatments   Labs (all labs ordered are listed, but only abnormal results are displayed) Labs Reviewed  CBC - Abnormal; Notable for the following components:      Result Value   RBC 5.46 (*)    All other components within normal limits  COMPREHENSIVE METABOLIC PANEL - Abnormal; Notable for the following components:   Potassium 3.2 (*)    All other components within normal limits  I-STAT CHEM 8, ED - Abnormal; Notable for the following components:   Potassium 3.2 (*)    BUN 22 (*)    Hemoglobin 15.6 (*)    All other components within normal limits  ETHANOL  PROTIME-INR  APTT  DIFFERENTIAL  RAPID URINE DRUG SCREEN, HOSP PERFORMED  URINALYSIS, ROUTINE W REFLEX MICROSCOPIC  I-STAT BETA HCG BLOOD, ED ([facility], [facility], [facility] ONLY)  TROPONIN I (HIGH SENSITIVITY)  TROPONIN I (HIGH SENSITIVITY)    EKG EKG Interpretation  Date/Time:  Friday March 27 2021 05:36:35 EDT Ventricular Rate:  76 PR Interval:  155 QRS Duration: 106 QT Interval:  396 QTC Calculation: 446 R Axis:   70 Text Interpretation: Sinus rhythm No significant change was found Confirmed by  Glynn Octave [phone]) on 03/27/2021 5:44:48 AM  Radiology CT HEAD WO CONTRAST  Result Date: 03/27/2021 CLINICAL DATA:  50 year old female with generalized weakness. Trouble swallowing for 24 hours. Right arm tingling and weakness. EXAM: CT HEAD WITHOUT CONTRAST TECHNIQUE: Contiguous axial images were obtained from the base of the skull through the vertex without intravenous contrast. COMPARISON:  None. FINDINGS: Brain: No midline shift, ventriculomegaly, mass effect, evidence of mass lesion, intracranial hemorrhage or evidence of cortically based acute infarction. Gray-white matter differentiation is within normal limits throughout the brain. No encephalomalacia identified. Small volume subarachnoid CSF over both convexities. Vascular: Mild Calcified atherosclerosis at the skull base. No suspicious intracranial  vascular hyperdensity. Skull: Negative. Sinuses/Orbits: Visualized paranasal sinuses and mastoids are clear. Other: Visualized orbits and scalp soft tissues are within normal limits. IMPRESSION: Negative noncontrast CT appearance of the brain. Electronically Signed   By: Odessa Fleming M.D.   On: 03/27/2021 07:19    Procedures Procedures   Medications Ordered in ED Medications - No data to display  ED Course  I have reviewed the triage vital signs and the nursing notes.  Pertinent labs & imaging results that were available during my care of the patient were reviewed by me and considered in my medical decision making (see chart for details).    MDM Rules/Calculators/A&P                         Greater than 24 hours of right-sided numbness and 48 hours of visual problems with nausea, vomiting, dizziness, difficulty speaking, and difficulty swallowing.  Code stroke not activated due to delay in presentation  Potassium is low this will be replaced.  Patient is hypertensive.  We will allow permissive hypertension given her history of right-sided numbness.  CT scan is negative for acute  infarct or hemorrhage. Will plan for MRI given her right-sided numbness, difficulty speaking and difficulty swallowing.  May need neurology evaluation pending these results. Question possible CVA, TIA, myasthenia gravis, multiple sclerosis.  Care to be transferred to Dr. Madilyn Hook at shift change.  Final Clinical Impression(s) / ED Diagnoses Final diagnoses:  None    Rx / DC Orders ED Discharge Orders     None        Jalayna Josten, Jeannett Senior, MD 03/27/21 [phone]

## 2021-03-27 NOTE — ED Notes (Signed)
Echo at bedside

## 2021-03-27 NOTE — Progress Notes (Signed)
Carotid arterial duplex has been completed.  Results can be found under chart review under CV PROC. 03/27/2021 3:45 PM Beryle Zeitz RVT, RDMS

## 2021-03-27 NOTE — ED Provider Notes (Signed)
Patient care assumed at 0700. Patient here for evaluation of generalized weakness, tingling in right arm, difficulty swallowing that has been present for 24 to 48 hours. MRI pending.   MRI consistent with acute CVA. Neurology consulted. Medicine consulted for admission.   Tilden Fossa, MD 03/27/21 [phone]

## 2021-03-27 NOTE — Progress Notes (Signed)
Echocardiogram 2D Echocardiogram has been performed.  Warren Lacy Shallyn Constancio 03/27/2021, 1:08 PM

## 2021-03-27 NOTE — H&P (Addendum)
Date: 03/27/2021               Patient Name:  Tanya Nichols MRN: [id number]  DOB: 06/26/1971 Age / Sex: 50 y.o., female   PCP: Default, Provider, MD         Medical Service: Internal Medicine Teaching Service         Attending Physician: Dr. Oswaldo Done, Marquita Palms, *    First Contact: Dr. Austin Miles Pager: [phone]  Second Contact: Dr. Huel Cote Pager: [phone]       After Hours (After 5p/  First Contact Pager: [phone]  weekends / holidays): Second Contact Pager: [phone]   Chief Complaint: Facial droop  History of Present Illness:  Tanya Nichols is a 50 yo F w/ no known PMH presenting to [facility] with dysarthria, dysphagia, facial droop, nausea, vomiting. She was in her usual state of health until 03/25/21 at 10am when she developed acute onset vertigo with nausea and vomiting. She initially thought that she had food poisoning but she noticed right upper extremity tingling numbness as well as trouble speaking and swallowing. She mentions significant concern with medical costs and had hoped her symptoms would resolve on her own but eventually went to Urgent Care for evaluation and was transferred to [facility] for further work-up. She mentions that since being seen in Urgent Care and ED, her slurred speech, nausea and vomiting has resolved.  She mentions that she had not received any medical care for the last 20 years when she saw a doctor in [place]. She denies any prior history of diabetes, hyperlipidemia, hypertension or arrhythmia. She denies any prior history of MI or Stroke. Only medications she had taken in the past were some multi-vitamins.  On review of systems, she denies any fevers, chills, chest pain, palpitations, cough or shortness of breath. She did have episode of nausea and NBNB emesis but denies hematemesis or melena. She denies any diarrhea, constipation, dysuria, urinary frequency, or urinary urgency. She denies any head trauma, syncope, jerking movements. She denies any blurry  vision, headaches, or focal weakness.  Meds: None  Allergies: Allergies as of 03/26/2021   (Not on File)   No past medical history on file.  Family History: Aunt had variety of medical issues but no significant medical conditions in immediate family  Social History: Lives with parents. Has multiple dogs as pets. Works as Investment banker, operational. Denies any alcohol, tobacco, illicit substance use  Review of Systems: A complete ROS was negative except as per HPI.   Physical Exam: Blood pressure (!) 179/98, pulse 74, temperature 98.7 F (37.1 C), resp. rate 17, height 5\' 4"  (1.626 m), weight 92.1 kg, SpO2 100 %.  Gen: Well-developed, well nourished, NAD HEENT: NCAT head, hearing intact, EOMI, + L facial droop Neck: supple, ROM intact, no JVD CV: RRR, S1, S2 normal, No rubs, no murmurs, no gallops Pulm: CTAB, No rales, no wheezes Abd: Soft, BS+, NTND, No rebound, no guarding Extm: ROM intact, Peripheral pulses intact, no peripheral edema Skin: Dry, Warm, normal turgor Neurologic exam: Mental status: A&Ox3 Cranial Nerves:             II: PERRL             III, IV, VI: Extra-occular motions intact bilaterally             V, VII: + L facial droop, sensation intact in all 3 divisions               VIII: hearing normal to rubbing fingers  bilaterally               IX, X: palate rises symmetrically             XI: Head turn and shoulder shrug normal bilaterally               XII: tongue midline    Motor: Strength 5/5 on all upper and lower extremities, bulk muscle and tone are normal  Sensory: + paresthesia on RUE Coordination: There is no dysmetria on finger-to-nose. Rapid alternating movement test normal. Heel to shin test normal. Psychiatric: Normal mood and affect  EKG: personally reviewed my interpretation is normal sinus, no ischemic changes  CXR: N/A  Assessment & Plan by Problem: Active Problems:   L acute pontomedullary junction infarct  Tanya Nichols is a 50 yo F w/ no known PMH  presenting to [facility] with dysarthria, dysphagia, facial droop, paresthesia 2/2 CVA  Dysarthria, Dysphagia, RUE Paresthesia 2/2 L acute pontomedullary junction infarct Present w/ facial droop, trouble swallowing, slurred speech. CT head negative. MR showing acute infarct at pontomedullary junction. No prior screening for risk factors for stroke. Will require admission for further work-up and risk factor management, but patient express significant concern regarding financial barriers. - Neurology consult - Allow for permissive HTN in the setting of acute CVA (systolic < 220 and diastolic < 120) - ASA 325 mg / 81 mg daily  - Start atorvastatin 40mg  daily - Echocardiogram, Carotid doppler  - A1C, Lipid panel  - Tele monitoring  - SLP eval, PT/OT - Aspiration precautions  HTN Admit bp 169/95 although much more hypertensive during my evaluation. Will likely need oral anti-hypertensives after window for permissive hypertension - Monitor  DVT prophx: lovenox Diet: NPO til swallow screen Bowel: Senokot Code: Full  Prior to Admission Living Arrangement: HOme Anticipated Discharge Location: Home Barriers to Discharge: Medical work-up  Dispo: Admit patient to Observation with expected length of stay less than 2 midnights.  Signed: , MD 03/27/2021, 1:19 PM Pager: [phone] After 5pm on weekdays and 1pm on weekends: On Call Pager: [phone]

## 2021-03-27 NOTE — ED Notes (Signed)
Pt transported to MRI at this time 

## 2021-03-27 NOTE — Evaluation (Signed)
Clinical/Bedside Swallow Evaluation Patient Details  Name: Tanya Nichols MRN: [id number] Date of Birth: Mar 04, 1971  Today's Date: 03/27/2021 Time: SLP Start Time (ACUTE ONLY): 1451 SLP Stop Time (ACUTE ONLY): 1515 SLP Time Calculation (min) (ACUTE ONLY): 23.93 min  Past Medical History: No past medical history on file.  HPI:  50 yo F w/ no known PMH presenting to [facility] with dysarthria, dysphagia, facial droop, nausea, vomiting. She was in her usual state of health until 03/25/21 at 10am when she developed acute onset vertigo with nausea and vomiting. MRI showed acute infarct at left pontomedullary junction. Pt failed [employer] screen.   Assessment / Plan / Recommendation Clinical Impression  Pt presents with a significant pharyngeal dysphagia with deficits in CN V sensory function (diminished sensation temperature pathway right), CN VII motor (asymmetry left) at rest, and inability to trigger a pharyngeal swallow response.  Trials of ice chips, one teaspoon water, half tspn puree did not NOT elicit a complete swallow response, leading to expectoration of materials.  Continue NPO; SLP will f/u next date.  Hopefully there will be some spontaneous recovery next 24-48 hours - cortrak may be warranted.  MBS will be indicated as well. Discussed results/recs with Ms.Stockard, who verbalized understanding of plan and potential need for cortrak.  SLP will follow. D/W RN.  SLP Visit Diagnosis: Dysphagia, pharyngeal phase (R13.13)    Aspiration Risk       Diet Recommendation   NPO - allow pt to try sips and chips as able       Other  Recommendations Oral Care Recommendations: Oral care QID   Follow up Recommendations Other (comment) (tba)      Frequency and Duration min 3x week  1 week       Prognosis Prognosis for Safe Diet Advancement: Good      Swallow Study   General Date of Onset: 03/25/21 HPI: 50 yo F w/ no known PMH presenting to [facility] with dysarthria, dysphagia, facial droop,  nausea, vomiting. She was in her usual state of health until 03/25/21 at 10am when she developed acute onset vertigo with nausea and vomiting. MRI showed acute infarct at left pontomedullary junction. Pt failed [employer] screen. Type of Study: Bedside Swallow Evaluation Previous Swallow Assessment: no Diet Prior to this Study: NPO Temperature Spikes Noted: No Respiratory Status: Room air History of Recent Intubation: No Behavior/Cognition: Alert;Cooperative;Pleasant mood Oral Cavity Assessment: Within Functional Limits Oral Care Completed by SLP: No Oral Cavity - Dentition: Adequate natural dentition Vision: Functional for self-feeding Self-Feeding Abilities: Able to feed self Patient Positioning: Upright in bed Baseline Vocal Quality: Normal Volitional Cough: Strong Volitional Swallow: Unable to elicit    Oral/Motor/Sensory Function Overall Oral Motor/Sensory Function: Moderate impairment Facial ROM: Within Functional Limits Facial Symmetry: Suspected CN VII (facial) dysfunction;Abnormal symmetry left Facial Sensation: Reduced right;Suspected CN V (Trigeminal) dysfunction (diminished sensation of temperature on right) Lingual Symmetry: Abnormal symmetry right;Suspected CN XII (hypoglossal) dysfunction   Ice Chips Ice chips: Impaired Presentation: Self Fed Pharyngeal Phase Impairments: Unable to trigger swallow   Thin Liquid Thin Liquid: Impaired Pharyngeal  Phase Impairments: Unable to trigger swallow    Nectar Thick Nectar Thick Liquid: Not tested   Honey Thick Honey Thick Liquid: Not tested   Puree Puree: Impaired Pharyngeal Phase Impairments: Unable to trigger swallow   Solid     Solid: Not tested      Blenda Mounts Laurice 03/27/2021,3:30 PM Marchelle Folks L. Samson Frederic, MA CCC/SLP Acute Rehabilitation Services Office number [phone] Pager [phone]

## 2021-03-27 NOTE — Evaluation (Signed)
Physical Therapy Evaluation Patient Details Name: Tanya Nichols MRN: [id number] DOB: 12-03-1970 Today's Date: 03/27/2021   History of Present Illness  50 y.o. female presents to [facility] ED on 03/26/2021 with reports of nausea, vomiting, RUE numbness/tingling, dysphagia, and dysarthria. MRI demonstrates acute pontomedullary junction infarct. Pt reports insignificant PMH.  Clinical Impression  Pt presents to PT with deficits in sensation, gait, balance, and vestibular function. Pt reports R numbness, although this appears to have limited affects on mobility. Pt does demonstrate imbalance and reports dizziness with head turns when stationary and when ambulating. Vestibular assessment results documented in General comments. Pt will benefit from acute PT follow-up or outpatient PT services to aide in improving higher level balance and to provide further vestibular treatment.    Follow Up Recommendations Outpatient PT (preferably outpatient neuro with vestibular experience)    Equipment Recommendations  None recommended by PT    Recommendations for Other Services       Precautions / Restrictions Precautions Precautions: Fall Precaution Comments: horizontal nystagmus noted with L head turns Restrictions Weight Bearing Restrictions: No      Mobility  Bed Mobility Overal bed mobility: Independent                  Transfers Overall transfer level: Independent Equipment used: None                Ambulation/Gait Ambulation/Gait assistance: Supervision Gait Distance (Feet): 400 Feet Assistive device: None Gait Pattern/deviations: Step-through pattern Gait velocity: functional Gait velocity interpretation: >2.62 ft/sec, indicative of community ambulatory General Gait Details: pt with lateral drift and reports of dizziness with horizontal head turns when mobilizing. Pt is able to change gait speed, stop abruptly, change step length/height, and walk backward without  LOB  Stairs            Wheelchair Mobility    Modified Rankin (Stroke Patients Only) Modified Rankin (Stroke Patients Only) Pre-Morbid Rankin Score: No symptoms Modified Rankin: Slight disability     Balance Overall balance assessment: Needs assistance Sitting-balance support: No upper extremity supported;Feet supported Sitting balance-Leahy Scale: Good     Standing balance support: No upper extremity supported Standing balance-Leahy Scale: Good                               Pertinent Vitals/Pain Pain Assessment: No/denies pain    Home Living Family/patient expects to be discharged to:: Private residence Living Arrangements: Parent Available Help at Discharge: Family;Available 24 hours/day Type of Home: House Home Access: Stairs to enter Entrance Stairs-Rails: Can reach both Entrance Stairs-Number of Steps: 4 Home Layout: Multi-level Home Equipment: Grab bars - tub/shower      Prior Function Level of Independence: Independent         Comments: minimal driving reported, works as a Investment banker, operational, 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 6 days a week     Hand Dominance        Extremity/Trunk Assessment   Upper Extremity Assessment Upper Extremity Assessment: RUE deficits/detail RUE Sensation: decreased light touch    Lower Extremity Assessment Lower Extremity Assessment: RLE deficits/detail RLE Sensation: decreased light touch    Cervical / Trunk Assessment Cervical / Trunk Assessment: Normal  Communication   Communication: No difficulties  Cognition Arousal/Alertness: Awake/alert Behavior During Therapy: WFL for tasks assessed/performed Overall Cognitive Status: Within Functional Limits for tasks assessed  General Comments General comments (skin integrity, edema, etc.): VSS on RA. PT performs vestibular evaluation, pt is positive for horizontal nystagmus with VOR with leftward head turns,  reporting dizziness. Pt also symptomatic with horizontal head turns when ambulating, symptoms resolve quickly after cessation of turns. No other positive tests with vestibular exam    Exercises     Assessment/Plan    PT Assessment Patient needs continued PT services  PT Problem List Decreased balance;Decreased mobility;Impaired sensation       PT Treatment Interventions Gait training;Stair training;Balance training;Patient/family education;Other (comment) (vestibular exercise)    PT Goals (Current goals can be found in the Care Plan section)  Acute Rehab PT Goals Patient Stated Goal: to return home tomorrow PT Goal Formulation: With patient Time For Goal Achievement: 04/10/21 Potential to Achieve Goals: Good Additional Goals Additional Goal #1: Pt will score >19/24 on DGI to indicate a reduced risk for falls Additional Goal #2: Pt will be able to verbalize precautions for management of vestibular symptoms independently.    Frequency Min 4X/week   Barriers to discharge        Co-evaluation               AM-PAC PT "6 Clicks" Mobility  Outcome Measure Help needed turning from your back to your side while in a flat bed without using bedrails?: None Help needed moving from lying on your back to sitting on the side of a flat bed without using bedrails?: None Help needed moving to and from a bed to a chair (including a wheelchair)?: None Help needed standing up from a chair using your arms (e.g., wheelchair or bedside chair)?: None Help needed to walk in hospital room?: A Little Help needed climbing 3-5 steps with a railing? : A Little 6 Click Score: 22    End of Session   Activity Tolerance: Patient tolerated treatment well Patient left: in bed;with call bell/phone within reach;with bed alarm set Nurse Communication: Mobility status PT Visit Diagnosis: Other symptoms and signs involving the nervous system (R29.898)    Time: 1740-8144 PT Time Calculation (min) (ACUTE  ONLY): 34 min   Charges:   PT Evaluation $PT Eval Low Complexity: 1 Low          Arlyss Gandy, PT, DPT Acute Rehabilitation Pager: [phone]   Arlyss Gandy 03/27/2021, 3:59 PM

## 2021-03-27 NOTE — ED Notes (Signed)
Jullisa Grigoryan father [phone] requesting an update on the patient

## 2021-03-27 NOTE — ED Notes (Signed)
Md rancour notified of pts bp of 191/96

## 2021-03-27 NOTE — ED Notes (Signed)
Patient transported to CT 

## 2021-03-27 NOTE — Consult Note (Addendum)
Referring Physician: Dr. Oswaldo Done    Chief Complaint: Vertigo with N/A  HPI: Tanya Nichols is an 50 y.o. female who presented from a doctors office for evaluation of generalized weakness, right arm tingling, difficulty speaking and difficulty swallowing. CBG 101 and BP 166/105 on arrival to the ED.   The patient states that yesterday she first noticed symptoms on Wednesday with the feeling that her vision was "off", her visual perception of her surroundings being "slanted" diagonally. She then had multiple episodes of N/V, in addition to lightheadedness, swaying vertigo and difficulty walking. Her mouth seemed not to be moving normally when she was talking. Yesterday she developed additional symptoms of numbness and tingling to her face, arm and leg. She has not had any weakness. She has also had trouble swallowing, with food not traversing her throat normally.   LSN: Wednesday tPA Given: No: Out of the time window  No past medical history on file.   No family history on file. Social History:  has no history on file for tobacco use, alcohol use, and drug use.  Allergies: Not on File  Home Medications:  None  ROS: No CP or SOB. No headache. No abdominal pain or fever.   Physical Examination: Blood pressure (!) 179/98, pulse 74, temperature 98.7 F (37.1 C), resp. rate 17, height 5\' 4"  (1.626 m), weight 92.1 kg, SpO2 100 %.  HEENT: [place]/AT Lungs: Respirations unlabored Ext: Nonpitting edema with pigmentary changes, skin thickening and flaking skin to BLE below knees  Neurologic Examination: Mental Status: Alert, oriented x 5, thought content appropriate.  Speech fluent with intact comprehension and naming. No dysarthria. Able to follow all commands without difficulty. Cranial Nerves: II:  Visual fields intact with no extinction to DSS. PERRL.  III,IV, VI: No ptosis. EOMI without nystagmus.   V: Dysesthesis, decreased FT and temp sensation to right side of face.  VII: Decreased  movement on left lower quadrant of face relative to right when speaking normally and smiling with emotion, but not when deliberately grimacing.  VIII: Hearing intact to conversation IX,X: Palate rises symmetrically XI: Symmetric shoulder shrug XII: Midline tongue extension  Motor: Subtly decreased grip and biceps strength to RUE, otherwise 5/5 in that limb.  RLE 5/5 LUE 5/5 LLE 5/5 Subtle slow rightward and upwards drift of RUE when testing Barre (about 1 cm of movement) Sensory: Dysesthesia and decreased temp and FT to RUE and RLE Deep Tendon Reflexes:  2+ bilateral brachioradialis, biceps and patellae Cerebellar: No ataxia with FNF bilaterally Gait: Mildly wide-based and slightly unsteady after initially standing from seated position. No truncal ataxia when seated.     Results for orders placed or performed during the hospital encounter of 03/26/21 (from the past 48 hour(s))  Ethanol     Status: None   Collection Time: 03/27/21  5:47 AM  Result Value Ref Range   Alcohol, Ethyl (B) <10 <10 mg/dL    Comment: (NOTE) Lowest detectable limit for serum alcohol is 10 mg/dL.  For medical purposes only. Performed at [facility], [address], [place], [place]   Protime-INR     Status: None   Collection Time: 03/27/21  5:47 AM  Result Value Ref Range   Prothrombin Time 13.5 11.4 - 15.2 seconds   INR 1.0 0.8 - 1.2    Comment: (NOTE) INR goal varies based on device and disease states. Performed at [facility], [address], [place], [place]   APTT  Status: None   Collection Time: 03/27/21  5:47 AM  Result Value Ref Range   aPTT 34 24 - 36 seconds    Comment: Performed at [facility], [address], [place], [place] [address]  CBC     Status: Abnormal   Collection Time: 03/27/21  5:47 AM  Result Value Ref Range   WBC 10.5 4.0 - 10.5 K/uL   RBC 5.46 (H) 3.87 - 5.11 MIL/uL   Hemoglobin 14.3 12.0 - 15.0 g/dL   HCT 08.6 57.8 - 46.9  %   MCV 83.0 80.0 - 100.0 fL   MCH 26.2 26.0 - 34.0 pg   MCHC 31.6 30.0 - 36.0 g/dL   RDW 62.9 52.8 - 41.3 %   Platelets 329 150 - 400 K/uL   nRBC 0.0 0.0 - 0.2 %    Comment: Performed at [facility], [address] [place], [place] [address]  Differential     Status: None   Collection Time: 03/27/21  5:47 AM  Result Value Ref Range   Neutrophils Relative % 72 %   Neutro Abs 7.5 1.7 - 7.7 K/uL   Lymphocytes Relative 21 %   Lymphs Abs 2.2 0.7 - 4.0 K/uL   Monocytes Relative 6 %   Monocytes Absolute 0.7 0.1 - 1.0 K/uL   Eosinophils Relative 0 %   Eosinophils Absolute 0.0 0.0 - 0.5 K/uL   Basophils Relative 1 %   Basophils Absolute 0.1 0.0 - 0.1 K/uL   Immature Granulocytes 0 %   Abs Immature Granulocytes 0.02 0.00 - 0.07 K/uL    Comment: Performed at [facility], [address], [place], [place] [address]  Comprehensive metabolic panel     Status: Abnormal   Collection Time: 03/27/21  5:47 AM  Result Value Ref Range   Sodium 139 135 - 145 mmol/L   Potassium 3.2 (L) 3.5 - 5.1 mmol/L   Chloride 103 98 - 111 mmol/L   CO2 26 22 - 32 mmol/L   Glucose, Bld 97 70 - 99 mg/dL    Comment: Glucose reference range applies only to samples taken after fasting for at least 8 hours.   BUN 18 6 - 20 mg/dL   Creatinine, Ser 3.66 0.44 - 1.00 mg/dL   Calcium 9.4 8.9 - 44.0 mg/dL   Total Protein 7.8 6.5 - 8.1 g/dL   Albumin 4.6 3.5 - 5.0 g/dL   AST 26 15 - 41 U/L   ALT 20 0 - 44 U/L   Alkaline Phosphatase 75 38 - 126 U/L   Total Bilirubin 1.2 0.3 - 1.2 mg/dL   GFR, Estimated >34 >74 mL/min    Comment: (NOTE) Calculated using the CKD-EPI Creatinine Equation (2021)    Anion gap 10 5 - 15    Comment: Performed at [facility], [address] [place], [place] [address]  Troponin I (High Sensitivity)     Status: None   Collection Time: 03/27/21  5:47 AM  Result Value Ref Range   Troponin I (High Sensitivity) 10 <18 ng/L    Comment: (NOTE) Elevated high sensitivity  troponin I (hsTnI) values and significant  changes across serial measurements may suggest ACS but many other  chronic and acute conditions are known to elevate hsTnI results.  Refer to the "Links" section for chest pain algorithms and additional  guidance. Performed at [facility], [address] [place], [place] [address]   I-Stat beta hCG blood, ED  Status: None   Collection Time: 03/27/21  6:15 AM  Result Value Ref Range   I-stat hCG, quantitative <5.0 <5 mIU/mL   Comment 3            Comment:   GEST. AGE      CONC.  (mIU/mL)   <=1 WEEK        5 - 50     2 WEEKS       50 - 500     3 WEEKS       100 - 10,000     4 WEEKS     1,000 - 30,000        FEMALE AND NON-PREGNANT FEMALE:     LESS THAN 5 mIU/mL   I-stat chem 8, ED     Status: Abnormal   Collection Time: 03/27/21  6:18 AM  Result Value Ref Range   Sodium 142 135 - 145 mmol/L   Potassium 3.2 (L) 3.5 - 5.1 mmol/L   Chloride 103 98 - 111 mmol/L   BUN 22 (H) 6 - 20 mg/dL   Creatinine, Ser 1.610.80 0.44 - 1.00 mg/dL   Glucose, Bld 97 70 - 99 mg/dL    Comment: Glucose reference range applies only to samples taken after fasting for at least 8 hours.   Calcium, Ion 1.18 1.15 - 1.40 mmol/L   TCO2 28 22 - 32 mmol/L   Hemoglobin 15.6 (H) 12.0 - 15.0 g/dL   HCT 09.646.0 04.536.0 - 40.946.0 %  Urinalysis, Routine w reflex microscopic Urine, Clean Catch     Status: Abnormal   Collection Time: 03/27/21  9:02 AM  Result Value Ref Range   Color, Urine AMBER (A) YELLOW    Comment: BIOCHEMICALS MAY BE AFFECTED BY COLOR   APPearance HAZY (A) CLEAR   Specific Gravity, Urine 1.030 1.005 - 1.030   pH 6.0 5.0 - 8.0   Glucose, UA 50 (A) NEGATIVE mg/dL   Hgb urine dipstick NEGATIVE NEGATIVE   Bilirubin Urine NEGATIVE NEGATIVE   Ketones, ur 20 (A) NEGATIVE mg/dL   Protein, ur NEGATIVE NEGATIVE mg/dL   Nitrite NEGATIVE NEGATIVE   Leukocytes,Ua NEGATIVE NEGATIVE    Comment: Performed at [facility], [address] [place], [place]  [id number]  Troponin I (High Sensitivity)     Status: None   Collection Time: 03/27/21  9:02 AM  Result Value Ref Range   Troponin I (High Sensitivity) 9 <18 ng/L    Comment: (NOTE) Elevated high sensitivity troponin I (hsTnI) values and significant  changes across serial measurements may suggest ACS but many other  chronic and acute conditions are known to elevate hsTnI results.  Refer to the "Links" section for chest pain algorithms and additional  guidance. Performed at [facility], [address] [place], [place] [id number]    CT HEAD WO CONTRAST  Result Date: 03/27/2021 CLINICAL DATA:  50 year old female with generalized weakness. Trouble swallowing for 24 hours. Right arm tingling and weakness. EXAM: CT HEAD WITHOUT CONTRAST TECHNIQUE: Contiguous axial images were obtained from the base of the skull through the vertex without intravenous contrast. COMPARISON:  None. FINDINGS: Brain: No midline shift, ventriculomegaly, mass effect, evidence of mass lesion, intracranial hemorrhage or evidence of cortically based acute infarction. Gray-white matter differentiation is within normal limits throughout the brain. No encephalomalacia identified. Small volume subarachnoid CSF over both convexities. Vascular: Mild Calcified atherosclerosis at the skull base. No suspicious intracranial vascular hyperdensity. Skull: Negative. Sinuses/Orbits: Visualized paranasal sinuses and  mastoids are clear. Other: Visualized orbits and scalp soft tissues are within normal limits. IMPRESSION: Negative noncontrast CT appearance of the brain. Electronically Signed   By: Odessa Fleming M.D.   On: 03/27/2021 07:19   MR Brain W and Wo Contrast  Result Date: 03/27/2021 CLINICAL DATA:  Right-sided tingling, difficulty speaking, abnormal vision EXAM: MRI HEAD WITHOUT AND WITH CONTRAST TECHNIQUE: Multiplanar, multiecho pulse sequences of the brain and surrounding structures were obtained without and with intravenous contrast.  CONTRAST:  29mL GADAVIST GADOBUTROL 1 MMOL/ML IV SOLN COMPARISON:  None. FINDINGS: Brain: There is a subcentimeter area of mildly reduced diffusion at the left aspect of the pontomedullary junction. There is no acute infarction or intracranial hemorrhage. There is no intracranial mass, mass effect, or edema. There is no hydrocephalus or extra-axial fluid collection. Ventricles and sulci are normal in size and configuration. Patchy T2 hyperintensity in the supratentorial white matter is nonspecific but may reflect mild to moderate chronic microvascular ischemic changes. No abnormal enhancement. Vascular: Major vessel flow voids at the skull base are preserved. Skull and upper cervical spine: Normal marrow signal is preserved. Sinuses/Orbits: Minor mucosal thickening.  Orbits are unremarkable. Other: Sella is unremarkable.  Mastoid air cells are clear. 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 No mass or abnormal enhancement. Electronically Signed   By: Guadlupe Spanish M.D.   On: 03/27/2021 08:43    Assessment: 51 y.o. female presenting with acute onset dizziness, N/V and transient tilting sensation of her vision 1. Exam reveals right face, arm and leg sensory deficits and subtle RUE weakness with lateral drift on Barre.  2. MRI brain: Small acute infarct at the left pontomedullary junction. 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  3. The patient does not have a PCP and does not have any prior medical diagnoses. Severe HTN in the ED suggests underlying HTN with overlying physiological hypertensive response to stroke. BLE edema with chronic pigmentary changes has been ongoing for about 2 years per patient, and could reflect diabetic changes, venous stasis dermatitis or CHF 4. Stroke Risk Factors - See above regarding no prior medical diagnoses, but probable underlying HTN. 5. BUN slightly elevated, but Cr is  normal. Potassium mildly decreased. Glucose 97. LFTs normal. Coags normal. U/A reveals glycosuria and elevated urine ketones.  6. EKG: NSR.  7. Carotid U/S is pending  Recommendations: 1. HgbA1c, fasting lipid panel 2. MRA of head  3. TTE is pending 4. PT consult, OT consult, Speech consult 5. Start ASA 81 mg po qd following 325 mg oral load  6. Atorvastatin has been started by primary team 7. Risk factor modification 8. Telemetry monitoring 9. Frequent neuro checks 10. Assess for etiology of BLE distal pigmentary changes and nonpitting edema.  11. TSH (ordered) 12. Modified permissive HTN given possible CHF (lower extremity edema) and in the context of small brainstem stroke which is unlikely to have a significant penumbra. Treat SBP if > 180. After 24 hours normalize to goal of 120/80 by 15% reduction per day.  13. Assess for etiology of the patient's hypokalemia   @Electronically  signed: Dr.  03/27/2021, 1:21 PM

## 2021-03-27 NOTE — Progress Notes (Signed)
Pt left for MRI.

## 2021-03-28 ENCOUNTER — Inpatient Hospital Stay (HOSPITAL_COMMUNITY): Payer: BLUE CROSS/BLUE SHIELD

## 2021-03-28 DIAGNOSIS — E876 Hypokalemia: Secondary | ICD-10-CM

## 2021-03-28 DIAGNOSIS — I639 Cerebral infarction, unspecified: Principal | ICD-10-CM

## 2021-03-28 LAB — CBC
HCT: 41.6 % (ref 36.0–46.0)
Hemoglobin: 13.3 g/dL (ref 12.0–15.0)
MCH: 26.2 pg (ref 26.0–34.0)
MCHC: 32 g/dL (ref 30.0–36.0)
MCV: 82.1 fL (ref 80.0–100.0)
Platelets: 284 10*3/uL (ref 150–400)
RBC: 5.07 MIL/uL (ref 3.87–5.11)
RDW: 14.6 % (ref 11.5–15.5)
WBC: 8.3 10*3/uL (ref 4.0–10.5)
nRBC: 0 % (ref 0.0–0.2)

## 2021-03-28 LAB — LIPID PANEL
Cholesterol: 158 mg/dL (ref 0–200)
HDL: 41 mg/dL (ref >40)
LDL Cholesterol: 91 mg/dL (ref 0–99)
Total CHOL/HDL Ratio: 3.9 RATIO
Triglycerides: 132 mg/dL (ref <150)
VLDL: 26 mg/dL (ref 0–40)

## 2021-03-28 LAB — BASIC METABOLIC PANEL
Anion gap: 9 (ref 5–15)
BUN: 17 mg/dL (ref 6–20)
CO2: 24 mmol/L (ref 22–32)
Calcium: 8.7 mg/dL — ABNORMAL LOW (ref 8.9–10.3)
Chloride: 107 mmol/L (ref 98–111)
Creatinine, Ser: 0.82 mg/dL (ref 0.44–1.00)
GFR, Estimated: >60 mL/min (ref >60)
Glucose, Bld: 85 mg/dL (ref 70–99)
Potassium: 3.3 mmol/L — ABNORMAL LOW (ref 3.5–5.1)
Sodium: 140 mmol/L (ref 135–145)

## 2021-03-28 LAB — RAPID URINE DRUG SCREEN, HOSP PERFORMED
Amphetamines: NOT DETECTED
Barbiturates: NOT DETECTED
Benzodiazepines: NOT DETECTED
Cocaine: NOT DETECTED
Opiates: NOT DETECTED
Tetrahydrocannabinol: NOT DETECTED

## 2021-03-28 IMAGING — DX DG ABD PORTABLE 1V
1 series · 1 of 1 positions shown · non-contrast
Comparison: None.

CLINICAL DATA: Evaluate gastric catheter placement

EXAM:
PORTABLE ABDOMEN - 1 VIEW

[abdomen]
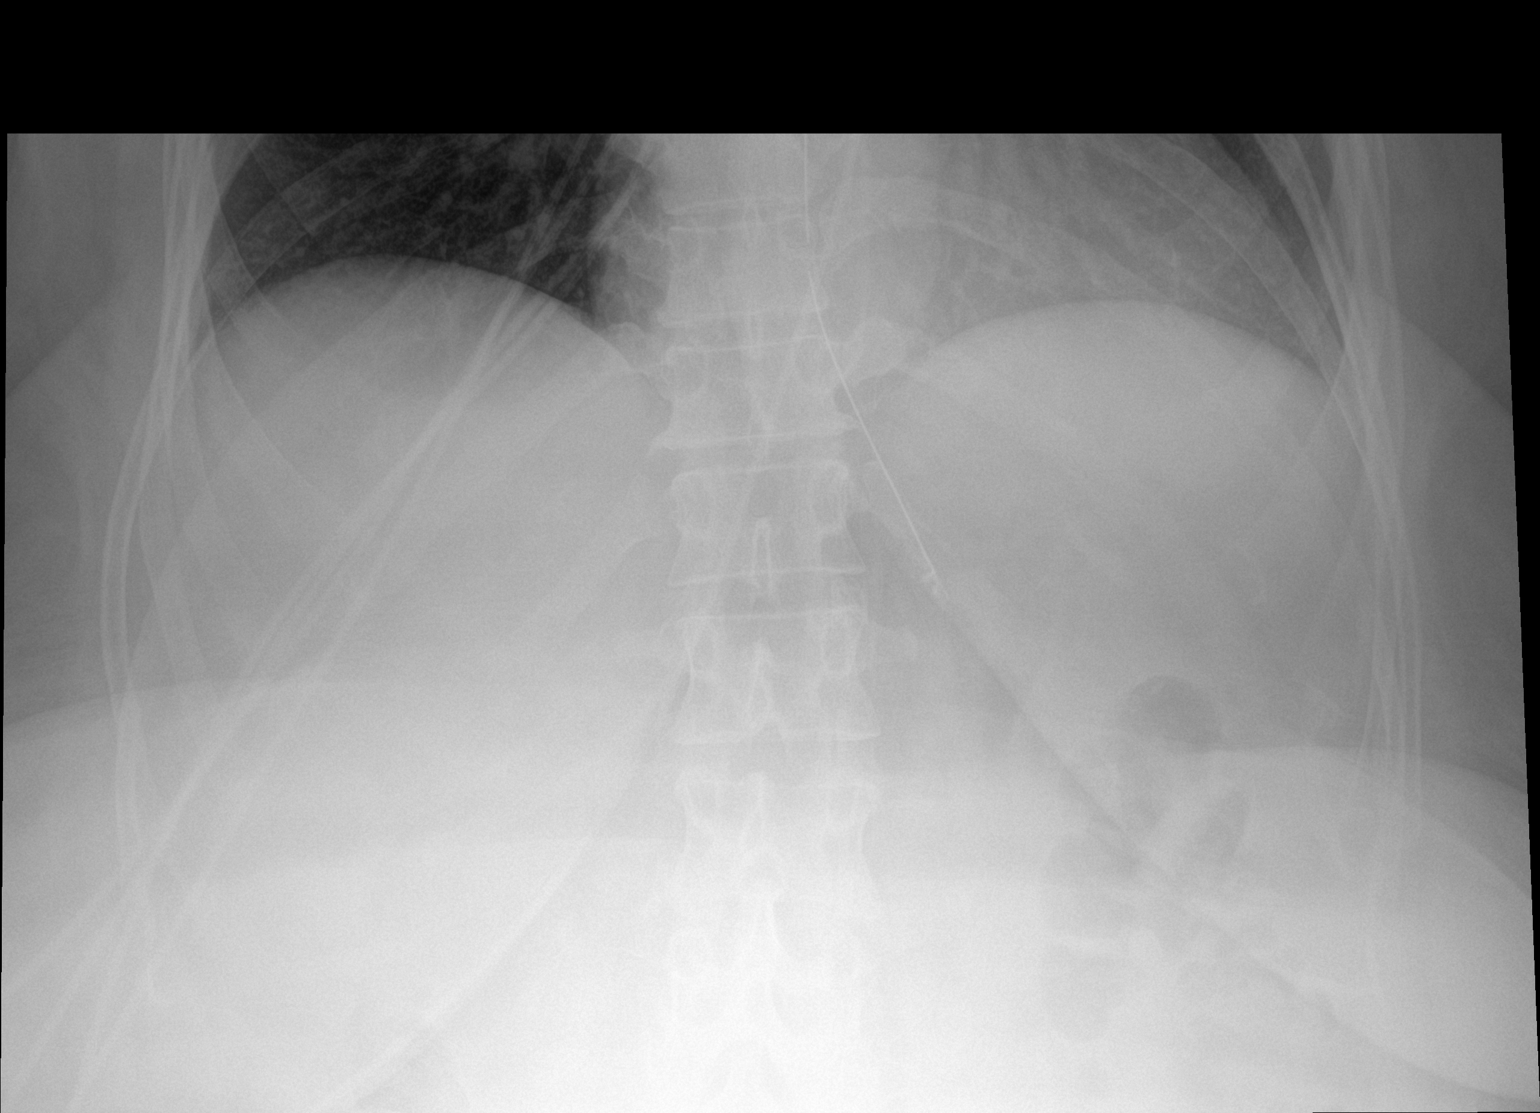

[1 of 1 positions shown; findings below may reference images not displayed]

FINDINGS: Gastric catheter is noted with the tip in the stomach although the
proximal side port lies in distal esophagus. This should be advanced
several cm deeper into the stomach.
IMPRESSION: Gastric catheter is described which should be advanced several cm
deeper into the stomach.

## 2021-03-28 MED ORDER — LABETALOL HCL 5 MG/ML IV SOLN
5.0000 mg | Freq: Once | INTRAVENOUS | Status: AC
  Administered 2021-03-28: 5 mg via INTRAVENOUS
  Filled 2021-03-28: qty 4

## 2021-03-28 MED ORDER — POLYVINYL ALCOHOL 1.4 % OP SOLN
1.0000 [drp] | OPHTHALMIC | Status: DC | PRN
  Administered 2021-03-28: 1 [drp] via OPHTHALMIC
  Filled 2021-03-28: qty 15

## 2021-03-28 MED ORDER — LABETALOL HCL 5 MG/ML IV SOLN
10.0000 mg | INTRAVENOUS | Status: AC | PRN
  Administered 2021-03-28 – 2021-03-29 (×2): 10 mg via INTRAVENOUS
  Filled 2021-03-28: qty 4

## 2021-03-28 MED ORDER — CLOPIDOGREL BISULFATE 75 MG PO TABS: 75.0000 mg | ORAL_TABLET | Freq: Every day | ORAL | Status: DC

## 2021-03-28 MED ORDER — POTASSIUM CHLORIDE 10 MEQ/100ML IV SOLN
10.0000 meq | INTRAVENOUS | Status: AC
  Administered 2021-03-28 (×2): 10 meq via INTRAVENOUS
  Filled 2021-03-28 (×4): qty 100

## 2021-03-28 MED ORDER — POTASSIUM CHLORIDE 10 MEQ/100ML IV SOLN
10.0000 meq | INTRAVENOUS | Status: AC
  Administered 2021-03-28 (×2): 10 meq via INTRAVENOUS

## 2021-03-28 MED ORDER — LISINOPRIL 10 MG PO TABS: 10.0000 mg | ORAL_TABLET | Freq: Every day | ORAL | Status: DC

## 2021-03-28 NOTE — Progress Notes (Addendum)
STROKE TEAM PROGRESS NOTE   INTERVAL HISTORY No acute events since last neurology visit. Tanya Nichols was sitting comfortably on the bed on my visit.   PERTINENT IMAGING  MRA Neck Pending  MRA Head Normal intracranial MRA.  2D Echo 17Jun2022 Left ventricular ejection fraction, by estimation, is 60 to 65%. The  left ventricle has normal function. The left ventricle has no regional  wall motion abnormalities. There is moderate asymmetric left ventricular hypertrophy. Left ventricular diastolic parameters were normal.   Right ventricular systolic function is normal. The right ventricular size is normal.  The mitral valve is normal in structure. Trivial mitral valve regurgitation. Moderate mitral annular calcification.  The aortic valve is normal in structure. Aortic valve regurgitation is not visualized. No aortic stenosis is present.   Carotid US 17Jun2022. Right Carotid: The extracranial vessels were near-normal with only minimal  wall thickening or plaque.  Left Carotid: The extracranial vessels were near-normal with only minimal  wall thickening or plaque.  Vertebrals:  Bilateral vertebral arteries demonstrate antegrade flow.  Subclavians: Normal flow hemodynamics were seen in bilateral subclavian            arteries.   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 No mass or abnormal enhancement  CT Head 18Jun2022 Negative noncontrast CT appearance of the brain.  Vitals:   03/28/21 0439 03/28/21 0730 03/28/21 1100 03/28/21 1515  BP: (!) 166/101 (!) 164/87 (!) 194/97 (!) 187/87  Pulse: 88 72 81 80  Resp: 16 20 20 20   Temp: 98.7 F (37.1 C) 97.8 F (36.6 C) 98.7 F (37.1 C) 97.9 F (36.6 C)  TempSrc: Oral Oral Oral Oral  SpO2: 98% 99% 97% 100%  Weight:      Height:        CBC:  Recent Labs  Lab 03/27/21 0547 03/27/21 0618 03/28/21 0623  WBC 10.5  --  8.3  NEUTROABS 7.5  --    --   HGB 14.3 15.6* 13.3  HCT 45.3 46.0 41.6  MCV 83.0  --  82.1  PLT 329  --  284    Basic Metabolic Panel:  Recent Labs  Lab 03/27/21 0547 03/27/21 0618 03/28/21 0623  NA 139 142 140  K 3.2* 3.2* 3.3*  CL 103 103 107  CO2 26  --  24  GLUCOSE 97 97 85  BUN 18 22* 17  CREATININE 0.92 0.80 0.82  CALCIUM 9.4  --  8.7*    Lipid Panel:  Recent Labs  Lab 03/28/21 0623  CHOL 158  TRIG 132  HDL 41  CHOLHDL 3.9  VLDL 26  LDLCALC 91   HgbA1c: No results found for: HGBA1C Urine Drug Screen: No results found for: LABOPIA, COCAINSCRNUR, LABBENZ, AMPHETMU, THCU, LABBARB  Alcohol Level     Component Value Date/Time   ETH <10 03/27/2021 0547    EXAMINATION   HEENT-  Normocephalic, no lesions, without obvious abnormality.  Normal external eye and conjunctiva.   Cardiovascular- well perfused Lungs-Breathing comfortably on room air Abdomen- soft Musculoskeletal-no obvious abnormality Skin-warm and dry, intact  Neurologic Exam: General: NAD, pleasant Mental Status: Alert, oriented, thought content appropriate.  Speech fluent without evidence of aphasia.  Able to follow commands without difficulty. Cranial Nerves: II:  Visual fields grossly normal, pupils equal, round, reactive to light and accommodation III,IV, VI: ptosis not present, extra-ocular motions intact bilaterally but there is saccadic dysmetria on lateral gaze (right. V,VII: smile symmetric, facial  light touch sensation normal bilaterally VIII: hearing normal bilaterally IX,X: uvula rises symmetrically XI: bilateral shoulder shrug XII: tongue with slight left-shift on extension, without atrophy or fasciculations. She appears to have full strength and mobility of the tongue. Motor: Right : Upper extremity   5/5    Left:     Upper extremity   5/5  Lower extremity   5/5     Lower extremity   5/5 Tone and bulk:normal tone throughout; no atrophy noted Sensory: Light touch sensation "different" (not described as  reduced) on the left compared to the right. RUE>RLE. Cerebellar: Normal finger-to-nose,no truncal ataxia when sitting Gait: Deferred   ASSESSMENT/PLAN [name] is a 50 y.o. healthy female who presented to [facility]  for evaluation of generalized weakness, right arm tingling, difficulty speaking and difficulty swallowing. She first noticed symptoms on 15Jun with the feeling that her vision was "off", her visual perception of her surroundings being "slanted" diagonally. She then had multiple episodes of N/V, in addition to lightheadedness, swaying vertigo and difficulty walking. Her mouth seemed not to be moving normally when she was talking. On 16Jun she developed additional symptoms of numbness and tingling to her face, arm and leg. She has not had any weakness. She has also had trouble swallowing.  Tanya Nichols is still experiencing sensory changes in the left arm, as well as trouble swallowing. Additional investigation into her history notes that she has recently begun a trampoline-based exercise program. She recently noted some sharp high-neck pain associated with exercise. She does not have a history of lupus, illicit drug use, miscarriage, OCP use, does not visit a chiropractor.  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 Tanya Nichols has mild to moderate microvascular changes on imaging. CTA head was negative. Carotid US negative. TTE showed moderate asymmetric left ventricular hypertrophy which points to longer-standing uncontrolled hypertension as an etiology for stroke. Additional workup is needed: - CTA neck (ordered) - Labs pending: Cardiolipin antibodies, ANA, UDS, HgbA1c - LDL is 91, goal <70; Continue lipitor 40mg  daily - DAPT: 81mg  ASA + Plavix 75 mg daily into  3 weeks followed by aspirin alone.  40mg  lipitor daily - Permissive hypertension  #DVT ppx Tanya Nichols is ambulatory, no SCDs - On lovenox 40mg  daily  #HTN Tanya Nichols does not have a history of HTN but BPs have been 160s-190s/80s-100s  in the hospital. - Trend BPs - Continue Labetalol 5mg  daily - Permissive hypertension OK (< 220/120) but gradually normalize in 5-7 days)  #Dispo Pending above labs and imaging - PT/OT suggests outpatient therapy follow-up - SLP reassessment appreciated - Home when medically appropriate.  Hospital day # 1  PhD, PA-C Neurology  STROKE ATTENDING NOTE : I have personally obtained history,examined this Tanya Nichols, reviewed notes, independently viewed imaging studies, participated in medical decision making and plan of care.ROS completed by me personally and pertinent positives fully documented  I have made any additions or clarifications directly to the above note. Agree with note above.  Tanya Nichols presented with sudden onset of nausea vomiting and ataxia due to left pontomedullary junction infarct etiology could be small vessel disease but given history of preceding neck pain as well as Tanya Nichols doing jumping activities possibility of vertebral artery dissection needs to be ruled out his carotid ultrasound is not good enough for that.  Neurological  exam is unremarkable except for mild saccadic dysmetria on lateral gaze.  She is also has some subjective paresthesias but no objective sensory loss.  Recommend check MRA of the neck.  Continue ongoing stroke work-up.  Continue antiplatelet therapy and aggressive risk factor modification.  Tanya Nichols counseled to avoid any rapid repetitive activities which involve jerking of the neck including habitual cough and dizziness.  Discussed with Tanya Nichols and Dr. Austin Miles.  Greater than 50% time during this 35-minute visit was spent in counseling and coordination of care about stroke and discussion with care team and  answering questions.  Delia Heady, MD Medical Director [facility] Pager: [phone] 03/28/2021 4:44 PM

## 2021-03-28 NOTE — Progress Notes (Addendum)
   Subjective:   Reports feeling better this morning, but her symptoms have only minimally improved.   Still having a lot of difficulty with swallowing. States that she is able to get it beyond her tongue, which is an improvement, but then has to spit it back out.   Wants to go home.   Objective:  Vital signs in last 24 hours: Vitals:   03/28/21 0031 03/28/21 0439 03/28/21 0730 03/28/21 1100  BP: (!) 175/85 (!) 166/101 (!) 164/87 (!) 194/97  Pulse: 83 88 72 81  Resp: 18 16 20 20   Temp: 98.4 F (36.9 C) 98.7 F (37.1 C) 97.8 F (36.6 C) 98.7 F (37.1 C)  TempSrc: Oral Oral Oral Oral  SpO2: 95% 98% 99% 97%  Weight:      Height:       Physical Exam: General: obese, middle-aged female, lying in bed, NAD. CV: normal rate and regular rhythm, no m/r/g. Pulm: CTABL, no adventitious sounds noted. Abdomen: soft, nondistended, nontender, normoactive bowel sounds. MSK: no peripheral edema noted. Neuro: AAOx4. Strength 5/5 bilaterally. Decreased sensation in right face, RUE, and RLE although reports improvement in sensation on medial portion of right foot. No obvious facial drooping noted. CN II-XII intact.  Psych: calm and cooperative.  Assessment/Plan:  Active Problems:   CVA (cerebral vascular accident) (HCC)   Acute CVA (cerebrovascular accident) (HCC)  Small acute L pontomedullary CVA Patient continues to have persistence of right sided sensatory deficits and swallowing difficulty, although appears to be slightly improved from yesterday. MR head negative. ECHO showing EF 60-65% with moderate asymmetric LVH, but no PFO. Carotid dopplers negative. SLP with recommendation for non-oral feeding as patient continues to have difficulty swallowing. Neurology following, greatly appreciate assistance. -continue ASA, lipitor for secondary prevention -LDL 91 -f/u HbA1c -TSH wnl -continue telemetry -NG tube for enteral nutrition and medications -aspiration precautions -PT/OT  recommending outpatient vestibular PT  HTN BP severely elevated since admission. Allowed for permissive HTN for past 24 hours. -IV labetalol 5mg  once (NPO) -awaiting placement of NG tube prior to oral antihypertensives  Hypokalemia K 3.2 on admission. -replete with 4 runs of IV KCl (NPO)  Prior to Admission Living Arrangement: Home Anticipated Discharge Location: Home Barriers to Discharge: continued medical management Dispo: Anticipated discharge in approximately 1-2 day(s).   , MD 03/28/2021, 2:14 PM Pager: [phone] After 5pm on weekdays and 1pm on weekends: On Call pager [phone]

## 2021-03-28 NOTE — Evaluation (Signed)
Occupational Therapy Evaluation Patient Details Name: Tanya Nichols MRN: [id number] DOB: [birth year] Today's Date: 03/28/2021    History of Present Illness 50 y.o. female presents to [facility] ED on 03/26/2021 with reports of nausea, vomiting, RUE numbness/tingling, dysphagia, and dysarthria. MRI demonstrates acute pontomedullary junction infarct. Pt reports insignificant PMH.   Clinical Impression   Pt admitted with acute pontomedullary junction infarct.  Pt noted to have decrease sensation to light touch and decrease in tempeture regulation in RUE/RLE. Pt educated on compensations due to findings. Pt lives with their parents and they have multiple dogs in the home. Pt noted to have lateral drift with ambulation and rotation or RLE with ambulation requiring occasional cues with ambulation. Pt currently with functional limitations due to the deficits listed below (see OT Problem List).  Pt will benefit from skilled acute OT to increase their safety and independence with ADL and functional mobility for ADL to facilitate discharge to venue listed below.      Follow Up Recommendations  Other (comment) (OP PT due to vestibular/balance)    Equipment Recommendations       Recommendations for Other Services       Precautions / Restrictions Precautions Precautions: Fall Restrictions Weight Bearing Restrictions: No      Mobility Bed Mobility Overal bed mobility: Independent                  Transfers Overall transfer level: Independent Equipment used: None                  Balance Overall balance assessment: Needs assistance Sitting-balance support: No upper extremity supported;Feet supported Sitting balance-Leahy Scale: Good     Standing balance support: No upper extremity supported Standing balance-Leahy Scale: Good                             ADL either performed or assessed with clinical judgement   ADL Overall ADL's : Needs  assistance/impaired Eating/Feeding: Independent;Sitting   Grooming: Wash/dry hands;Wash/dry face;Oral care;Independent;Standing   Upper Body Bathing: Modified independent;Standing   Lower Body Bathing: Modified independent;Sit to/from stand   Upper Body Dressing : Modified independent;Sitting;Standing   Lower Body Dressing: Modified independent;Sit to/from stand   Toilet Transfer: Modified Independent   Toileting- Clothing Manipulation and Hygiene: Modified independent;Sit to/from stand   Tub/ Shower Transfer: Modified independent;Cueing for safety;Cueing for sequencing   Functional mobility during ADLs: Supervision/safety;Cueing for safety;Cueing for sequencing (notes drift when ambulation and increase in rotation of R LE)       Vision Baseline Vision/History: Wears glasses Wears Glasses:  (per pt during driving) Patient Visual Report: Other (comment) (Pt noted when watching fast moving objects decrease in attending)       Perception     Praxis      Pertinent Vitals/Pain Pain Assessment: No/denies pain     Hand Dominance     Extremity/Trunk Assessment Upper Extremity Assessment Upper Extremity Assessment: RUE deficits/detail RUE Deficits / Details: decrease in sensation distally to elbow RUE Sensation: decreased light touch   Lower Extremity Assessment Lower Extremity Assessment: RLE deficits/detail RLE Sensation: decreased light touch   Cervical / Trunk Assessment Cervical / Trunk Assessment: Normal   Communication Communication Communication: No difficulties   Cognition Arousal/Alertness: Awake/alert Behavior During Therapy: WFL for tasks assessed/performed Overall Cognitive Status: Within Functional Limits for tasks assessed  General Comments       Exercises     Shoulder Instructions      Home Living Family/patient expects to be discharged to:: Private residence Living Arrangements:  Parent Available Help at Discharge: Family;Available 24 hours/day Type of Home: House Home Access: Stairs to enter [employer] of Steps: 4 Entrance Stairs-Rails: Can reach both Home Layout: Multi-level Alternate Level Stairs-Number of Steps: 20 Alternate Level Stairs-Rails: Can reach both Bathroom Shower/Tub: Walk-in shower;Tub/shower unit   Teacher, early years/pre: Yes   Home Equipment: Grab bars - tub/shower   Additional Comments: 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 6 days a week        OT Problem List: Impaired balance (sitting and/or standing);Impaired vision/perception      OT Treatment/Interventions: Self-care/ADL training;Therapeutic exercise;Neuromuscular education;Therapeutic activities;Patient/family education;Balance training    OT Goals(Current goals can be found in the care plan section) Acute Rehab OT Goals Patient Stated Goal: to be able to cook and drive OT Goal Formulation: With patient Time For Goal Achievement: 04/11/21 Potential to Achieve Goals: Good ADL Goals Additional ADL Goal #1: Pt will voice 2 compensations due to decrease in self regulation to tempatures and light touch Additional ADL Goal #2: Pt will tolerate 20 mins of activity with no signs of LOB with acitivity  OT Frequency: Min 2X/week   Barriers to D/C:            Co-evaluation              AM-PAC OT "6 Clicks" Daily Activity     Outcome Measure Help from another person eating meals?: None Help from another person taking care of personal grooming?: None Help from another person toileting, which includes using toliet, bedpan, or urinal?: None Help from another person bathing (including washing, rinsing, drying)?: None Help  from another person to put on and taking off regular upper body clothing?: None Help from another person to put on and taking off regular lower body clothing?: None 6 Click Score: 24   End of Session Equipment Utilized During Treatment: Gait belt Nurse Communication: Mobility status  Activity Tolerance: Patient tolerated treatment well Patient left: in bed;with call bell/phone within reach  OT Visit Diagnosis: Unsteadiness on feet (R26.81)                Time: 1245-8099 OT Time Calculation (min): 29 min Charges:  OT General Charges $OT Visit: 1 Visit OT Evaluation $OT Eval Low Complexity: 1 Low OT Treatments $Self Care/Home Management : 8-22 mins  Alphia Moh OTR/L  Acute Rehab Services  [phone] office number [phone] pager number   Alphia Moh 03/28/2021, 8:55 AM

## 2021-03-28 NOTE — Progress Notes (Signed)
  Speech Language Pathology Treatment: Dysphagia  Patient Details Name: Tanya Nichols MRN: [id number] DOB: [birth year] Today's Date: 03/28/2021 Time: 1330-1350 SLP Time Calculation (min) (ACUTE ONLY): 20 min  Assessment / Plan / Recommendation Clinical Impression  SLP was alerted by RN that patient is very hungry. SLP observed patient with ice chips and small cup sips of water. Patient also able to describe her swallow difficulties very well. SLP palpated larynx and did feel minimal movement, however laryngeal elevation is moderately impaired. After attempts at swallow, patient then spits out water into emesis bag. She had one instance of cough which likely was from premature spillage of water into laryngeal vestibule. Patient describes being able to transit water to back of tongue which she says is better from yesterday. Patient understanding that she is not ready for oral nutrition. SLP recommended patient continue with ice chip and small water sip trials to work on swallow recovery. After session, SLP spoke via phone call with MD who will place order for NG tube. SLP will follow up with patient next date.   HPI HPI: 50 yo F w/ no known PMH presenting to [facility] with dysarthria, dysphagia, facial droop, nausea, vomiting. She was in her usual state of health until 03/25/21 at 10am when she developed acute onset vertigo with nausea and vomiting. MRI showed acute infarct at left pontomedullary junction. Pt failed [employer] screen.      SLP Plan  Continue with current plan of care       Recommendations  Diet recommendations: NPO;Other(comment) (patient may continue trials with ice chips and water sips) Medication Administration: Via alternative means                Oral Care Recommendations: Oral care BID;Patient independent with oral care Follow up Recommendations: Outpatient SLP SLP Visit Diagnosis: Dysphagia, pharyngeal phase (R13.13) Plan: Continue with current plan of care        GO              Angela Nevin, MA, CCC-SLP Speech Therapy

## 2021-03-29 ENCOUNTER — Inpatient Hospital Stay (HOSPITAL_COMMUNITY): Payer: BLUE CROSS/BLUE SHIELD

## 2021-03-29 LAB — BASIC METABOLIC PANEL
Anion gap: 11 (ref 5–15)
BUN: 13 mg/dL (ref 6–20)
CO2: 24 mmol/L (ref 22–32)
Calcium: 8.9 mg/dL (ref 8.9–10.3)
Chloride: 106 mmol/L (ref 98–111)
Creatinine, Ser: 0.81 mg/dL (ref 0.44–1.00)
GFR, Estimated: >60 mL/min (ref >60)
Glucose, Bld: 105 mg/dL — ABNORMAL HIGH (ref 70–99)
Potassium: 3.5 mmol/L (ref 3.5–5.1)
Sodium: 141 mmol/L (ref 135–145)

## 2021-03-29 LAB — GLUCOSE, CAPILLARY
Glucose-Capillary: 95 mg/dL (ref 70–99)
Glucose-Capillary: 118 mg/dL — ABNORMAL HIGH (ref 70–99)

## 2021-03-29 IMAGING — MR MR MRA NECK W/O CM
1 of 2 series · 43 of 48 positions shown · non-contrast
Comparison: Brain MRI and intracranial MRA [DATE].

CLINICAL DATA: 49-year-old male who presented with weakness,
trouble swallowing. MRI revealed acute left lateral medullary
infarct.

EXAM:
MRA NECK WITHOUT CONTRAST
TECHNIQUE: Angiographic images of the neck were obtained using MRA technique
without intravenous contrast. Carotid stenosis measurements (when
applicable) are obtained utilizing NASCET criteria, using the distal
internal carotid diameter as the denominator.

[Series 6: [id number] (better) · axial · 0.8mm · 0.78mm/px · z∈[-143,+28]mm · 43 of 222 slices shown]
[im 1/222]
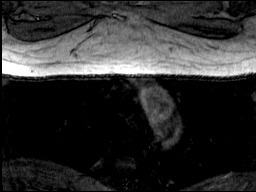
[im 6/222]
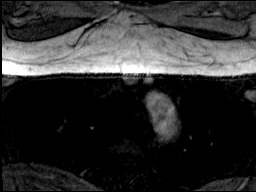
[im 11/222]
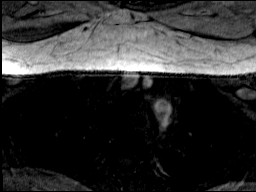
[im 16/222]
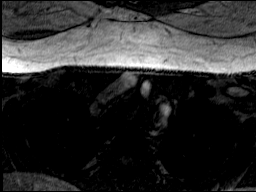
[im 22/222]
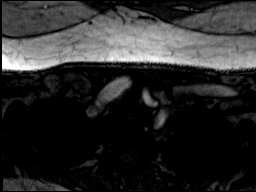
[im 27/222]
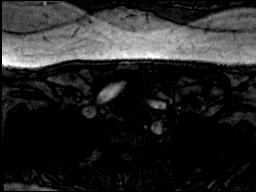
[im 32/222]
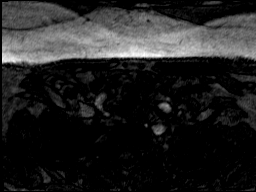
[im 37/222]
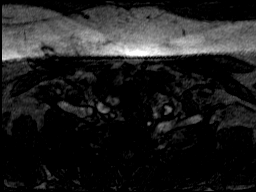
[im 43/222]
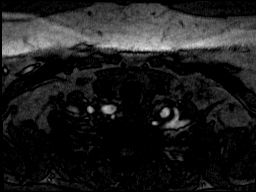
[im 48/222]
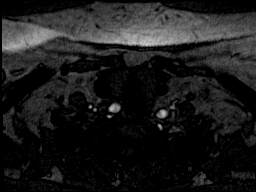
[im 53/222]
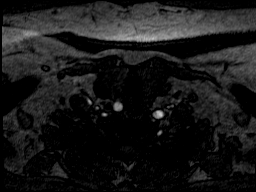
[im 58/222]
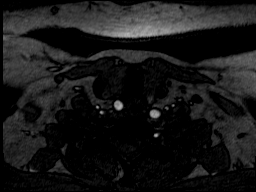
[im 64/222]
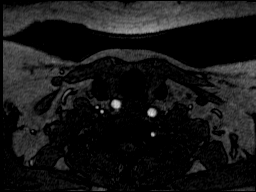
[im 69/222]
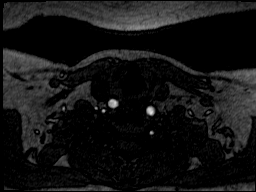
[im 74/222]
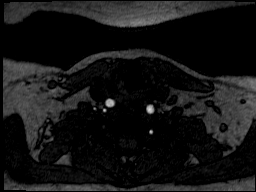
[im 79/222]
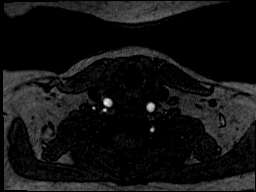
[im 85/222]
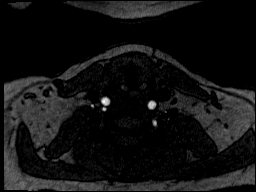
[im 90/222]
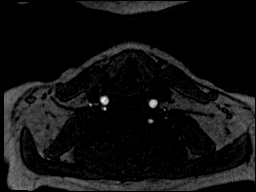
[im 95/222]
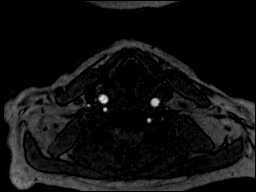
[im 100/222]
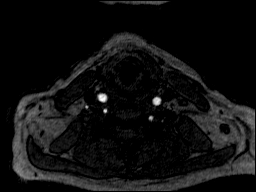
[im 106/222]
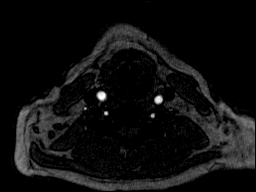
[im 111/222]
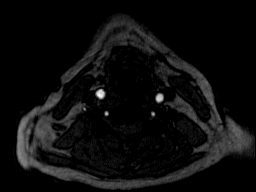
[im 116/222]
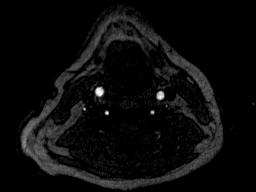
[im 122/222]
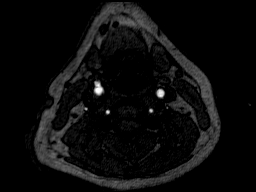
[im 127/222]
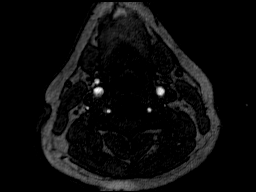
[im 132/222]
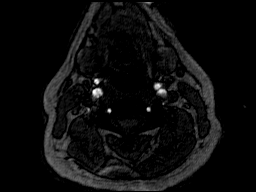
[im 137/222]
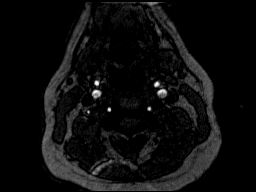
[im 143/222]
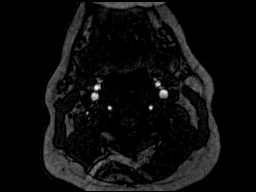
[im 148/222]
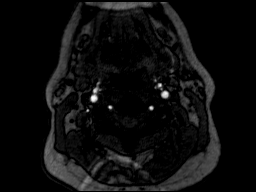
[im 153/222]
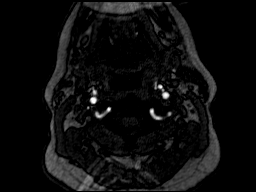
[im 158/222]
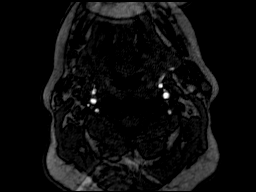
[im 164/222]
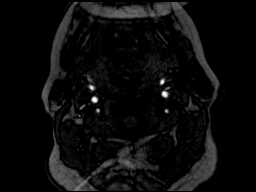
[im 169/222]
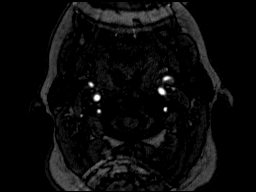
[im 174/222]
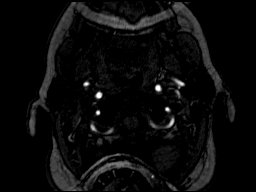
[im 179/222]
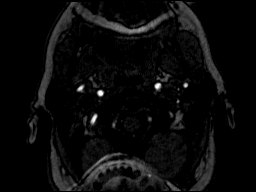
[im 185/222]
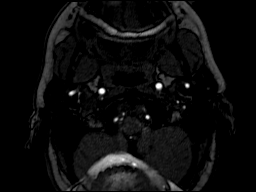
[im 190/222]
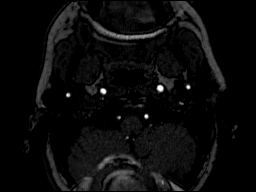
[im 195/222]
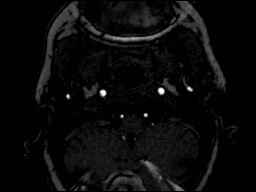
[im 200/222]
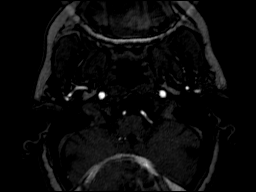
[im 206/222]
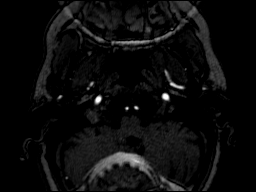
[im 211/222]
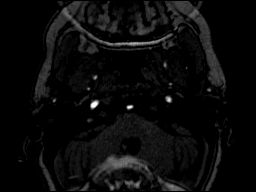
[im 216/222]
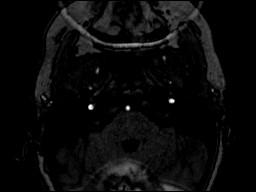
[im 222/222]
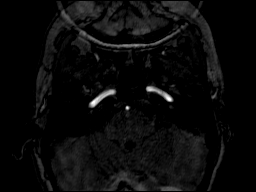

[43 of 48 positions shown; findings below may reference images not displayed]

FINDINGS: Axial time-of-flight neck MRA images.

Possible 4 vessel arch configuration, the left vertebral artery or
might arise just distal to the left subclavian origin. Proximal
great vessels appear negative aside from tortuosity.

Tortuous right CCA without stenosis. Right carotid bifurcation and
cervical right ICA appear negative through the petrous right ICA
siphon.

Tortuous left CCA without stenosis. Left carotid bifurcation and
cervical left ICA are within normal limits through the petrous
siphon.

Proximal subclavian arteries are patent with no stenosis evident.
Both vertebral artery origins are patent, the left might be directly
from the arch. Codominant vertebral arteries, the right has a late
entry into the cervical transverse foramen. Antegrade flow in both
vertebrals continues to the vertebrobasilar junction with no
stenosis identified. The left PICA origin is patent on series 6,
image 23.
IMPRESSION: Negative MRA of the Neck.

## 2021-03-29 MED ORDER — PANTOPRAZOLE SODIUM 40 MG PO PACK
40.0000 mg | PACK | Freq: Every day | ORAL | Status: DC
  Administered 2021-03-29 – 2021-04-05 (×7): 40 mg
  Filled 2021-03-29 (×9): qty 20

## 2021-03-29 MED ORDER — ASPIRIN EC 81 MG PO TBEC: 81.0000 mg | DELAYED_RELEASE_TABLET | Freq: Every day | ORAL | Status: DC

## 2021-03-29 MED ORDER — ASPIRIN 81 MG PO CHEW
81.0000 mg | CHEWABLE_TABLET | Freq: Every day | ORAL | Status: DC
  Administered 2021-03-29 – 2021-04-05 (×7): 81 mg via NASOGASTRIC
  Filled 2021-03-29 (×7): qty 1

## 2021-03-29 MED ORDER — FAMOTIDINE 20 MG PO TABS
20.0000 mg | ORAL_TABLET | Freq: Once | ORAL | Status: AC
  Administered 2021-03-29: 20 mg
  Filled 2021-03-29: qty 1

## 2021-03-29 MED ORDER — CLOPIDOGREL BISULFATE 75 MG PO TABS
75.0000 mg | ORAL_TABLET | Freq: Every day | ORAL | Status: DC
  Administered 2021-03-29 – 2021-03-31 (×3): 75 mg via NASOGASTRIC
  Filled 2021-03-29 (×3): qty 1

## 2021-03-29 MED ORDER — OSMOLITE 1.2 CAL PO LIQD
1000.0000 mL | ORAL | Status: DC
  Administered 2021-03-29 – 2021-03-30 (×2): 1000 mL
  Filled 2021-03-29 (×2): qty 1000

## 2021-03-29 MED ORDER — POTASSIUM CHLORIDE 20 MEQ PO PACK
40.0000 meq | PACK | Freq: Two times a day (BID) | ORAL | Status: AC
  Administered 2021-03-29 (×2): 40 meq via ORAL
  Filled 2021-03-29 (×2): qty 2

## 2021-03-29 MED ORDER — ATORVASTATIN CALCIUM 40 MG PO TABS
40.0000 mg | ORAL_TABLET | Freq: Every day | ORAL | Status: DC
  Administered 2021-03-29 – 2021-04-05 (×7): 40 mg via NASOGASTRIC
  Filled 2021-03-29 (×7): qty 1

## 2021-03-29 MED ORDER — LOSARTAN POTASSIUM 50 MG PO TABS
50.0000 mg | ORAL_TABLET | Freq: Every day | ORAL | Status: DC
  Administered 2021-03-29 – 2021-03-30 (×2): 50 mg via NASOGASTRIC
  Filled 2021-03-29 (×2): qty 1

## 2021-03-29 NOTE — Progress Notes (Signed)
  Speech Language Pathology Treatment: Dysphagia  Patient Details Name: Tanya Nichols MRN: [id number] DOB: 1970/12/22 Today's Date: 03/29/2021 Time: 1240-1300 SLP Time Calculation (min) (ACUTE ONLY): 20 min  Assessment / Plan / Recommendation Clinical Impression  Patient seen to address dysphagia goals. NG has been placed for medication and nutrition delivery. Patient did not report any noticeable change in her swallow function since yesterday. SLP provided handouts, verbal instructions and demonstration of swallow exercises with patient able to return demonstrate and verbalize understanding. SLP also spoke with patient about possibility she would need PEG for longer term nutrition secondary to the severity of her dysphagia. SLP encouraged patient to perform swallow exercises and ST services will continue to follow patient for readiness for MBS.   HPI HPI: 50 yo F w/ no known PMH presenting to [facility] with dysarthria, dysphagia, facial droop, nausea, vomiting. She was in her usual state of health until 03/25/21 at 10am when she developed acute onset vertigo with nausea and vomiting. MRI showed acute infarct at left pontomedullary junction. Pt failed [employer] screen.      SLP Plan  Continue with current plan of care       Recommendations  Diet recommendations: NPO;Other(comment) (may have ice chips, water sips PRN) Liquids provided via: Cup Medication Administration: Via alternative means Supervision: Patient able to self feed Postural Changes and/or Swallow Maneuvers: Seated upright 90 degrees                Follow up Recommendations: Outpatient SLP SLP Visit Diagnosis: Dysphagia, pharyngeal phase (R13.13) Plan: Continue with current plan of care       Angela Nevin, MA, CCC-SLP Speech Therapy [facility] Acute Rehab

## 2021-03-29 NOTE — Progress Notes (Signed)
STROKE TEAM PROGRESS NOTE   INTERVAL HISTORY Patient did not pass swallow eval and hence has NG tube now for medications and nutrition.  She states her speech and facial droop have recovered back to baseline.  He has no new complaints. MRI of the neck shows patent bilateral vertebral arteries without evidence of dissection or high-grade stenosis.  Both carotid arteries are also unremarkable PERTINENT IMAGING  MRA Neck Poor extracranial carotid arteries are tortuous but no hemodynamically significant stenosis.  Both vertebral arteries are patent  MRA Head Normal intracranial MRA.  2D Echo 17Jun2022 Left ventricular ejection fraction, by estimation, is 60 to 65%. The  left ventricle has normal function. The left ventricle has no regional  wall motion abnormalities. There is moderate asymmetric left ventricular hypertrophy. Left ventricular diastolic parameters were normal.   Right ventricular systolic function is normal. The right ventricular size is normal.  The mitral valve is normal in structure. Trivial mitral valve regurgitation. Moderate mitral annular calcification.  The aortic valve is normal in structure. Aortic valve regurgitation is not visualized. No aortic stenosis is present.   Carotid US 17Jun2022. Right Carotid: The extracranial vessels were near-normal with only minimal  wall thickening or plaque.  Left Carotid: The extracranial vessels were near-normal with only minimal  wall thickening or plaque.  Vertebrals:  Bilateral vertebral arteries demonstrate antegrade flow.  Subclavians: Normal flow hemodynamics were seen in bilateral subclavian            arteries.   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 No mass or abnormal enhancement  CT Head 18Jun2022 Negative noncontrast CT appearance of the brain.  Vitals:   03/29/21 0603 03/29/21 0906 03/29/21 1228 03/29/21 1333  BP: (!)  196/99 (!) 194/101 (!) 181/102 (!) 185/95  Pulse: 88 92 96 93  Resp: 17 16 16    Temp: 98.2 F (36.8 C) 98.7 F (37.1 C) 98.3 F (36.8 C)   TempSrc: Oral Oral Oral   SpO2: 96% 99% 98% 99%  Weight:      Height:        CBC:  Recent Labs  Lab 03/27/21 0547 03/27/21 0618 03/28/21 0623  WBC 10.5  --  8.3  NEUTROABS 7.5  --   --   HGB 14.3 15.6* 13.3  HCT 45.3 46.0 41.6  MCV 83.0  --  82.1  PLT 329  --  284    Basic Metabolic Panel:  Recent Labs  Lab 03/28/21 0623 03/29/21 0046  NA 140 141  K 3.3* 3.5  CL 107 106  CO2 24 24  GLUCOSE 85 105*  BUN 17 13  CREATININE 0.82 0.81  CALCIUM 8.7* 8.9    Lipid Panel:  Recent Labs  Lab 03/28/21 0623  CHOL 158  TRIG 132  HDL 41  CHOLHDL 3.9  VLDL 26  LDLCALC 91   HgbA1c: No results found for: HGBA1C Urine Drug Screen:     Component Value Date/Time   LABOPIA NONE DETECTED 03/28/2021 1637   COCAINSCRNUR NONE DETECTED 03/28/2021 1637   LABBENZ NONE DETECTED 03/28/2021 1637   AMPHETMU NONE DETECTED 03/28/2021 1637   THCU NONE DETECTED 03/28/2021 1637   LABBARB NONE DETECTED 03/28/2021 1637    Alcohol Level     Component Value Date/Time   ETH <10 03/27/2021 0547    EXAMINATION   HEENT-  Normocephalic, no lesions, without obvious abnormality.  Normal external eye and conjunctiva.   Cardiovascular- well perfused Lungs-Breathing  comfortably on room air Abdomen- soft Musculoskeletal-no obvious abnormality Skin-warm and dry, intact  Neurologic Exam: General: NAD, pleasant Mental Status: Alert, oriented, thought content appropriate.  Speech fluent without evidence of aphasia.  Able to follow commands without difficulty. Cranial Nerves: II:  Visual fields grossly normal, pupils equal, round, reactive to light and accommodation III,IV, VI: ptosis not present, extra-ocular motions intact bilaterally but there is saccadic dysmetria on lateral gaze (right. V,VII: smile symmetric, facial light touch sensation normal  bilaterally VIII: hearing normal bilaterally IX,X: uvula rises symmetrically XI: bilateral shoulder shrug XII: tongue with slight left-shift on extension, without atrophy or fasciculations. She appears to have full strength and mobility of the tongue. Motor: Right : Upper extremity   5/5    Left:     Upper extremity   5/5  Lower extremity   5/5     Lower extremity   5/5 Tone and bulk:normal tone throughout; no atrophy noted Sensory: Light touch sensation "different" (not described as reduced) on the left compared to the right. RUE>RLE. Cerebellar: Normal finger-to-nose,no truncal ataxia when sitting Gait: Deferred   ASSESSMENT/PLAN [name] is a 50 y.o. healthy female who presented to [facility]  for evaluation of generalized weakness, right arm tingling, difficulty speaking and difficulty swallowing. She first noticed symptoms on 15Jun with the feeling that her vision was "off", her visual perception of her surroundings being "slanted" diagonally. She then had multiple episodes of N/V, in addition to lightheadedness, swaying vertigo and difficulty walking. Her mouth seemed not to be moving normally when she was talking. On 16Jun she developed additional symptoms of numbness and tingling to her face, arm and leg. She has not had any weakness. She has also had trouble swallowing.  Tanya Nichols is still experiencing sensory changes in the left arm, as well as trouble swallowing. Additional investigation into her history notes that she has recently begun a trampoline-based exercise program. She recently noted some sharp high-neck pain associated with exercise. She does not have a history of lupus, illicit drug use, miscarriage, OCP use, does not visit a chiropractor.  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 Tanya Nichols has mild to moderate microvascular changes on imaging. CTA head was negative. Carotid US negative. TTE showed moderate asymmetric left ventricular hypertrophy which points to longer-standing uncontrolled hypertension as an etiology for stroke. Additional workup is needed: - CTA neck (ordered) - Labs pending: Cardiolipin antibodies, ANA, UDS, HgbA1c - LDL is 91, goal <70; Continue lipitor 40mg  daily - DAPT: 81mg  ASA + Plavix 75 mg daily into 3 weeks followed by aspirin alone.  40mg  lipitor daily - Permissive hypertension  #DVT ppx Patient is ambulatory, no SCDs - On lovenox 40mg  daily  #HTN Tanya Nichols does not have a history of HTN but BPs have been 160s-190s/80s-100s  in the hospital. - Trend BPs - Continue Cozaar 50 mg daily - Permissive hypertension OK (< 220/120) but gradually normalize in 5-7 days)  #Dispo Pending above labs and imaging - PT/OT suggests outpatient therapy follow-up - SLP reassessment appreciated - Home when medically appropriate.  Hospital day # 2    Patient presented with sudden onset of nausea vomiting and ataxia due to left pontomedullary junction infarct etiology likely small  vessel disease as MRA of the neck has ruled out vertebral dissection.  She has multiple newly identified risk factors in the form of obesity, hypertension, hyperlipidemia and she appears to be at risk for sleep apnea.  Neurological exam is unremarkable except for mild saccadic dysmetria on lateral gaze.  She is also has some subjective paresthesias but no objective sensory loss.  She has significant dysphagia and has a NG tube for nutrition.  She may consider possible participation in the sleep smart study for stroke prevention if interested given information to review and decide.   .  Discussed with patient and Dr.  Austin Miles.  Greater than 50% time during this 25-minute visit was spent in counseling and coordination of care about stroke and discussion with care team and answering questions.  Delia Heady, MD Medical Director [facility] Pager: [phone] 03/29/2021 1:38 PM

## 2021-03-29 NOTE — Progress Notes (Signed)
Brief Nutrition Note RD working remotely.  Consult received for enteral/tube feeding initiation and management.  Adult Enteral Nutrition Protocol initiated. Full assessment to follow. Patient is NPO. A 14 F NGT placed in R nare yesterday at 1745.  Admitting Dx: CVA (cerebral vascular accident) (HCC) [I63.9] Acute CVA (cerebrovascular accident) (HCC) [I63.9] Right sided numbness [R20.0]  Body mass index is 34.84 kg/m. Pt meets criteria for obesity based on current BMI.  Labs:  Recent Labs  Lab 03/27/21 0547 03/27/21 0618 03/28/21 0623 03/29/21 0046  NA 139 142 140 141  K 3.2* 3.2* 3.3* 3.5  CL 103 103 107 106  CO2 26  --  24 24  BUN 18 22* 17 13  CREATININE 0.92 0.80 0.82 0.81  CALCIUM 9.4  --  8.7* 8.9  GLUCOSE 97 97 85 105*       Trenton Gammon, MS, RD, LDN, CNSC Inpatient Clinical Dietitian RD pager # available in [facility]  After hours/weekend pager # available in [facility]

## 2021-03-29 NOTE — Progress Notes (Signed)
   Subjective:   Tanya Nichols states she feels her swallow function is improving some but she is not quite able to swallow yet. She continues to have paresthesias in her hands and feet. She is no longer experiencing vertigo but continues to experience some unsteadiness with ambulation. At home, she does have numerous stairs.   Objective:  Vital signs in last 24 hours: Vitals:   03/28/21 1556 03/28/21 2006 03/28/21 2322 03/29/21 0603  BP: (!) 189/96 (!) 193/100 (!) 189/101 (!) 196/99  Pulse: 85 89 93 88  Resp: 18 18 18 17   Temp: 98 F (36.7 C) 98.5 F (36.9 C) 98.4 F (36.9 C) 98.2 F (36.8 C)  TempSrc: Oral Oral Oral Oral  SpO2: 100% 98% 96% 96%  Weight:      Height:       Physical Exam: General: obese, middle-aged female, sitting up in bed, NAD. CV: normal rate and regular rhythm, no m/r/g. Pulm: CTABL, no adventitious sounds noted. Abdomen: soft, nondistended, nontender, normoactive bowel sounds. Neuro: AAOx4, strength 5/5 bilaterally. Blunted sensation in right face, RUE, and RLE although reporting improvement in sensation in right index finger and thumb and on medial portion of right foot. No facial drooping noted or dysarthria noted. CN II-XII intact. Psych: calm and cooperative.  Assessment/Plan:  Active Problems:   CVA (cerebral vascular accident) (HCC)   Acute CVA (cerebrovascular accident) (HCC)  Small acute L pontomedullary CVA Patient with some improvement in right sided sensatory deficits although not resolved. She continues to have difficulty with swallowing, although she reports some mild improvement with this as well. NG tube placed yesterday per SLP recommendations for non-oral feeding. If patient's swallowing function does not improve over the next couple of days, may need to consider PEG tube placement for more long-term nutrition. Will speak to neurology in regards to this and will continue to have patient work with SLP.  -Neurology following, appreciate  assistance. -MRA neck negative -ECHO with LVEF 60-65%, moderate asymmetric LVH, but no PFO -continue ASA, lipitor for secondary prevention -f/u HbA1c -continue telemetry -NG tube for enteral nutrition and medications -continue workings with SLP -aspiration precautions -PT/OT recommending outpatient vestibular PT  HTN BP severely elevated since admission. Permissive HTN window close to being completed, will slowly normalize BP over next couple of days. -starting losartan per tube today   Prior to Admission Living Arrangement: Home Anticipated Discharge Location: Home Barriers to Discharge: continued medical management Dispo: Anticipated discharge in approximately 1-2 day(s).

## 2021-03-30 ENCOUNTER — Other Ambulatory Visit: Payer: Self-pay

## 2021-03-30 ENCOUNTER — Encounter (HOSPITAL_COMMUNITY): Payer: Self-pay | Admitting: Student in an Organized Health Care Education/Training Program

## 2021-03-30 ENCOUNTER — Inpatient Hospital Stay (HOSPITAL_COMMUNITY): Payer: BLUE CROSS/BLUE SHIELD

## 2021-03-30 LAB — BASIC METABOLIC PANEL
Anion gap: 6 (ref 5–15)
BUN: 17 mg/dL (ref 6–20)
CO2: 26 mmol/L (ref 22–32)
Calcium: 9.4 mg/dL (ref 8.9–10.3)
Chloride: 109 mmol/L (ref 98–111)
Creatinine, Ser: 0.86 mg/dL (ref 0.44–1.00)
GFR, Estimated: >60 mL/min (ref >60)
Glucose, Bld: 148 mg/dL — ABNORMAL HIGH (ref 70–99)
Potassium: 4.1 mmol/L (ref 3.5–5.1)
Sodium: 141 mmol/L (ref 135–145)

## 2021-03-30 LAB — GLUCOSE, CAPILLARY
Glucose-Capillary: 103 mg/dL — ABNORMAL HIGH (ref 70–99)
Glucose-Capillary: 126 mg/dL — ABNORMAL HIGH (ref 70–99)
Glucose-Capillary: 145 mg/dL — ABNORMAL HIGH (ref 70–99)
Glucose-Capillary: 148 mg/dL — ABNORMAL HIGH (ref 70–99)
Glucose-Capillary: 171 mg/dL — ABNORMAL HIGH (ref 70–99)

## 2021-03-30 LAB — HEMOGLOBIN A1C
Hgb A1c MFr Bld: 5.6 % (ref 4.8–5.6)
Mean Plasma Glucose: 114 mg/dL

## 2021-03-30 LAB — MAGNESIUM: Magnesium: 2.2 mg/dL (ref 1.7–2.4)

## 2021-03-30 IMAGING — DX DG ABD PORTABLE 1V
1 series · 1 of 1 positions shown · non-contrast
Comparison: Radiograph [DATE]

CLINICAL DATA: Feeding tube placement

EXAM:
PORTABLE ABDOMEN - 1 VIEW

[abdomen]
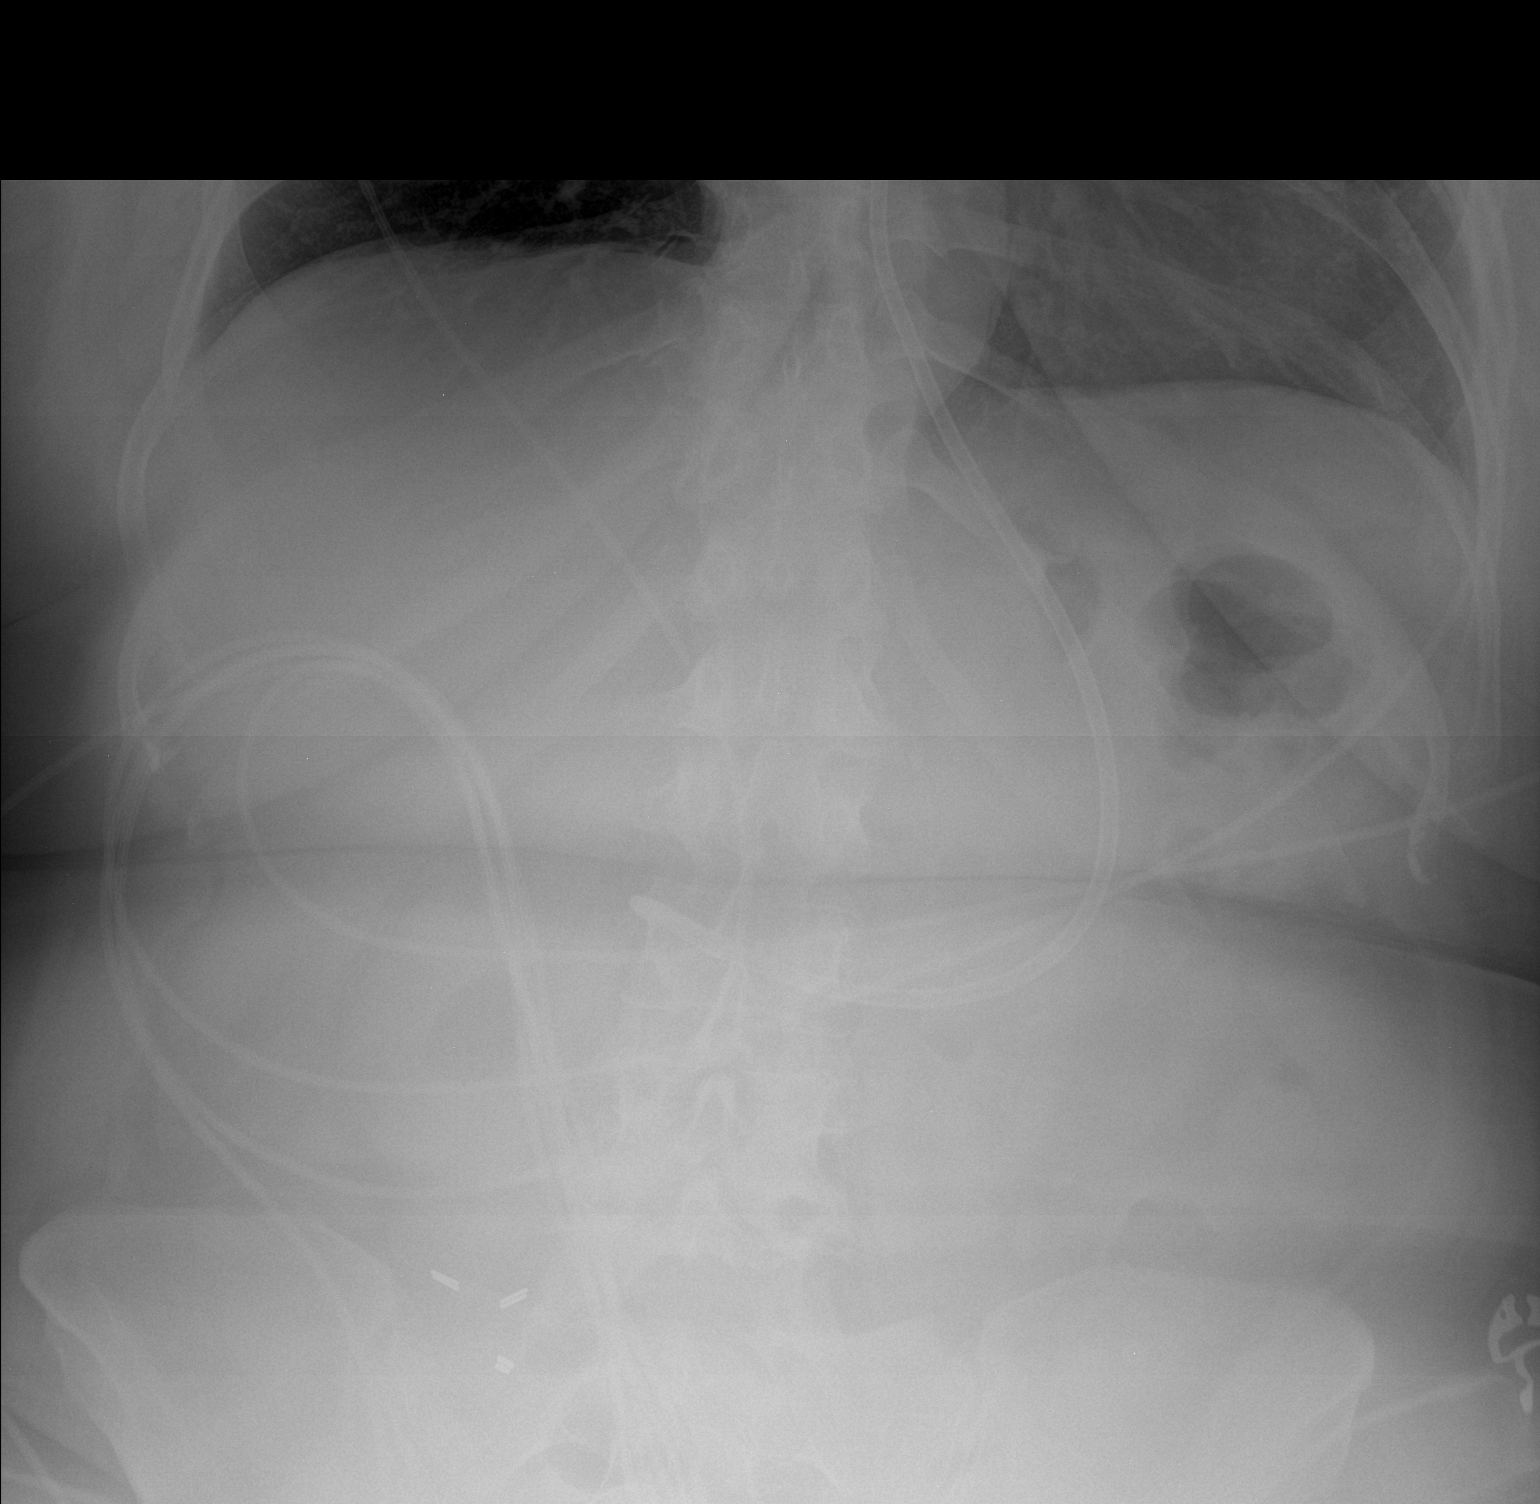

[1 of 1 positions shown; findings below may reference images not displayed]

FINDINGS: Feeding tube tip overlies the distal stomach. Nonobstructive bowel
gas pattern. Small metallic clips overlie the right lower abdomen.
IMPRESSION: Feeding tube tip overlies the distal stomach.

## 2021-03-30 MED ORDER — LOSARTAN POTASSIUM 50 MG PO TABS
100.0000 mg | ORAL_TABLET | Freq: Every day | ORAL | Status: DC
  Administered 2021-03-31 – 2021-04-05 (×5): 100 mg via NASOGASTRIC
  Filled 2021-03-30 (×5): qty 2

## 2021-03-30 MED ORDER — CHLORHEXIDINE GLUCONATE 0.12 % MT SOLN
15.0000 mL | Freq: Two times a day (BID) | OROMUCOSAL | Status: DC
  Administered 2021-04-05: 15 mL via OROMUCOSAL
  Filled 2021-03-30 (×5): qty 15

## 2021-03-30 MED ORDER — JEVITY 1.2 CAL PO LIQD
1000.0000 mL | ORAL | Status: DC
  Administered 2021-03-30 – 2021-04-04 (×5): 1000 mL
  Filled 2021-03-30 (×10): qty 1000

## 2021-03-30 MED ORDER — ORAL CARE MOUTH RINSE
15.0000 mL | Freq: Two times a day (BID) | OROMUCOSAL | Status: DC
  Administered 2021-04-05: 15 mL via OROMUCOSAL

## 2021-03-30 NOTE — Progress Notes (Signed)
Patient noted with persistent cough during assessment, left upper lung sound noted with exp, wheeze upon auscultation. Patient denied hx smoking, hx asthma, or any other respiratory distress. Pt with NG tube and voices that this cough is new, she has been coughing since last night. MD paged and aware. Rounding team will assess.

## 2021-03-30 NOTE — Progress Notes (Addendum)
   Subjective:   Cough has improved. Feeds currently running 50 cc's per hour with free water.   Discussed with her if she is having persistent cough to let the team know. Patient states no cough since NG tube was repositioned in place, she also feels as though she is no longer having wheezing.   Attempted to sip water, causes coughing. Overall feels as though difficulty swallowing has improved. Patient feels as though she can prevent coughing, but has difficulty with controlling muscle and its ability to relax.   Discussed changing to smaller feeding tube. Patient feels irritated with bigger feeding tube, discussed discomfort with placing smaller one. She states she will try to tolerate larger tube to prevent having to have another placed.   Dc'd cbg's. Right sided sensation improved. Temperature sensation has improved in right hand.   Objective:  Vital signs in last 24 hours: Vitals:   03/30/21 0006 03/30/21 0402 03/30/21 0741 03/30/21 1205  BP: (!) 178/101 (!) 182/90 (!) 181/87 (!) 172/94  Pulse: 88 90 84 98  Resp: 18 18 16 16   Temp: 98.6 F (37 C) 98.8 F (37.1 C) 98.6 F (37 C) 98.9 F (37.2 C)  TempSrc: Oral Oral Oral Oral  SpO2: 100% 96% 98% 96%  Weight:  89.6 kg    Height:       Physical Exam: General: Obese, middle-aged female, sitting up in bed, NAD. CV: Normal rate and regular rhythm, no murmurs rubs or gallops. Pulm: Clear to auscultation bilaterally, no adventitious sounds noted. Neuro: AAOx4, strength 5 out of 5 bilaterally.  Decreased sensation in the right face, right upper extremity, and right lower extremity, although some areas with improved sensation (not along any particular dermatomal pattern) CN II-XII intact.  No facial drooping or dysarthria noted. Psych: Calm and cooperative. Skin: Warm and dry.  Assessment/Plan:  Active Problems:   CVA (cerebral vascular accident) (HCC)   Acute CVA (cerebrovascular accident) (HCC)  Small acute L pontomedullary  CVA Patient continues to have improvement in right-sided sensory deficits compared to yesterday, although they have not completely resolved.  She does continue to have difficulty with swallowing.  SLP evaluated patient today, will perform modified barium swallow and subsequently have core track team come to place cortrak for nutrition and improve comfort compared to NG tube, per patient request.  Hopeful that patient will regain swallowing function and thus we will hold off on PEG tube at this time.  Can reevaluate for this at the end of this week if necessary. -Continue aspirin, Lipitor for secondary prevention -Follow-up hemoglobin A1c -Follow-up modified barium swallow study -Cortrak placement -PT/OT recommending outpatient vestibular PT  HTN Patient's blood pressures continue to remain elevated.  Started on losartan 50 mg yesterday.  Will increase this to 100 mg daily per tube.  Should patient's blood pressure continue to remain elevated with max dose Cozaar, will add hydrochlorothiazide for more optimal blood pressure control.  Goal to slowly normalize BP over the next few days. -Cozaar 100 mg daily per tube  Prior to Admission Living Arrangement: Home Anticipated Discharge Location: Home Barriers to Discharge: continued medical management Dispo: Anticipated discharge in approximately 2-3 day(s).   , MD Pager: [phone] 03/30/2021, 1:41 PM

## 2021-03-30 NOTE — Progress Notes (Signed)
Modified Barium Swallow Progress Note  Patient Details  Name: Tanya Nichols MRN: [id number] Date of Birth: November 20, 1970  Today's Date: 03/30/2021  Modified Barium Swallow completed.  Full report located under Chart Review in the Imaging Section.  Brief recommendations include the following:  Clinical Impression  Pt presents with severe pharyngeal dysphagia.  Oral function WNL.  There is good base-of-tongue propulsion.  There is noted anterior laryngeal movement, but minimal laryngeal elevation and no pharyngeal contraction. The epiglottis does not invert over the larynx.  The pharyngeal swallow response is not elicited.  Thin liquids were immediately aspirated, eliciting a strong cough response.  Nectar thick liquids and applesauce were expectorated before they could enter the airway. Pt's laryngeal-pharyngeal mechanism is not functional at this time.  Sensation appears to have improved.  Recommend proceeding with cortrak for nutrition.   Swallow Evaluation Recommendations       SLP Diet Recommendations: NPO;Ice chips PRN after oral care                       Oral Care Recommendations: Oral care QID;Patient independent with oral care       Tanya Roemer L. Samson Frederic, MA CCC/SLP Acute Rehabilitation Services Office number [phone] Pager [phone]  Blenda Mounts Laurice 03/30/2021,1:49 PM

## 2021-03-30 NOTE — Plan of Care (Signed)
Patient understand plan of care

## 2021-03-30 NOTE — Progress Notes (Signed)
  Speech Language Pathology Treatment: Dysphagia  Patient Details Name: Tanya Nichols MRN: [id number] DOB: 1971/08/30 Today's Date: 03/30/2021 Time: 5732-2025 SLP Time Calculation (min) (ACUTE ONLY): 17 min  Assessment / Plan / Recommendation Clinical Impression  Pt has made had no appreciable improvement in swallowing function over the weekend.  She has improved sensory function (temp pathway) and is cognizant of the swallowing "almost" happening.  Laryngeal movement is palpable, but she still does not appear to cross the threshold for swallow trigger.  Pt is expectorating her saliva and unable to swallow. She reports improved gag sensation, but gag response has been demonstrated to have no real correlation with pharyngeal swallow.  She is very uncomfortable with large bore NG.  Spoke with Internal Medicine.  Will plan for MBS today.  Recommend D/Cing NG to optimize function.  If she does poorly, Cortrak team is on board to place smaller tube that will be a little more comfortable.  SLP will follow.  MBS today 1 or 1:30.   HPI HPI: 50 yo F w/ no known PMH presenting to [facility] with dysarthria, dysphagia, facial droop, nausea, vomiting. She was in her usual state of health until 03/25/21 at 10am when she developed acute onset vertigo with nausea and vomiting. MRI showed acute infarct at left pontomedullary junction. Pt failed [employer] screen.      SLP Plan  MBS;Continue with current plan of care       Recommendations  Diet recommendations: NPO Medication Administration: Via alternative means                Oral Care Recommendations: Oral care QID Follow up Recommendations: Outpatient SLP SLP Visit Diagnosis: Dysphagia, pharyngeal phase (R13.13) Plan: MBS;Continue with current plan of care       GO               Tanya Kassa L. Samson Frederic, MA CCC/SLP Acute Rehabilitation Services Office number [phone] Pager [phone]  Blenda Mounts Laurice 03/30/2021, 11:05 AM

## 2021-03-30 NOTE — Procedures (Signed)
Cortrak  Person Inserting Tube:  Graceyn Fodor, Verdon Cummins, RD Tube Type:  Cortrak - 43 inches Tube Size:  10 Tube Location:  Right nare Initial Placement:  Stomach Secured by: Bridle Technique Used to Measure Tube Placement:  Marking at nare/corner of mouth Cortrak Secured At:  65 cm Cortrak Tube Team Note:  Consult received to place a Cortrak feeding tube.   X-ray is required, abdominal x-ray has been ordered by the Cortrak team. Please confirm tube placement before using the Cortrak tube.   If the tube becomes dislodged please keep the tube and contact the Cortrak team at [email or web address] ([email or web address]) for replacement.  If after hours and replacement cannot be delayed, place a NG tube and confirm placement with an abdominal x-ray.    Eugene Gavia, MS, RD, LDN Pronouns: She/Her/Hers RD pager number and weekend/on-call pager number located in [place].

## 2021-03-30 NOTE — TOC Initial Note (Addendum)
Transition of Care [facility]) - Initial/Assessment Note    Patient Details  Name: Tanya Nichols MRN: [id number] Date of Birth: Jul 11, 1971  Transition of Care [facility]) CM/SW Contact:    Kingsley Plan, RN Phone Number: 03/30/2021, 10:58 AM  Clinical Narrative:                 Spoke to SP. Patient for barium swallow study today. Patient would benefit from OP SP. Insurance will not cover home heealth and OP at the same time.   Discussed with patient. To go home with tube feeding she would need a PEG. Patient / family would need to be taught PEG care example flushing , medications and tube feeding prior to discharge , because [facility] would not be able to make daily visits. Patient voiced understanding and is confident she can manage PEG and feedings.   NCM explained NCM would order tube feedings through an agency. Patient stated she does not want tube feeding at home like she has here.  Patient stated she can taste the tube feeding in her mouth and it is unpleasant.  She is a Investment banker, operational and wants to make things like oatmeal, and soups in the blender and put it through PEG. NCM explained normally dietitian recommends tube feedings based on calories/protein etc. This would need to be approved by MD etc. Patient voiced understanding.   Patient interested in outpatient speech at [facility]  or [facility].   Patient had an appointment with a MD in [place] for tomorrow to establish care , however since she is still in hospital her motr has rescheduled for next week . Patient does not know the name of the provider.   To start OP SP prior to her appointment current MD would need to consent to continuing to follow until that appointment .   Secure chatted MD regarding following her and patient's plan on tube feeding   Patient lives with parents.   Per DR Huel Cote they are expecting recovery , they will decided at end of the week , if patient does need  a PEG it will be placed early next week   Expected Discharge  Plan: Home/Self Care     Patient Goals and CMS Choice Patient states their goals for this hospitalization and ongoing recovery are:: to return to home CMS Medicare.gov Compare Post Acute Care list provided to:: Patient    Expected Discharge Plan and Services Expected Discharge Plan: Home/Self Care   Discharge Planning Services: CM Consult   Living arrangements for the past 2 months: Single Family Home                                      Prior Living Arrangements/Services Living arrangements for the past 2 months: Single Family Home Lives with:: Parents Patient language and need for interpreter reviewed:: Yes Do you feel safe going back to the place where you live?: Yes      Need for Family Participation in Patient Care: Yes (Comment) Care giver support system in place?: Yes (comment)   Criminal Activity/Legal Involvement Pertinent to Current Situation/Hospitalization: No - Comment as needed  Activities of Daily Living   ADL Screening (condition at time of admission) Patient's cognitive ability adequate to safely complete daily activities?: Yes  Permission Sought/Granted   Permission granted to share information with : No              Emotional Assessment  Appearance:: Appears stated age Attitude/Demeanor/Rapport: Self-Confident Affect (typically observed): Accepting Orientation: : Oriented to Self, Oriented to Place, Oriented to  Time, Oriented to Situation Alcohol / Substance Use: Not Applicable Psych Involvement: No (comment)  Admission diagnosis:  CVA (cerebral vascular accident) (HCC) [I63.9] Acute CVA (cerebrovascular accident) (HCC) [I63.9] Right sided numbness [R20.0] Patient Active Problem List   Diagnosis Date Noted   CVA (cerebral vascular accident) (HCC) 03/27/2021   Acute CVA (cerebrovascular accident) (HCC) 03/27/2021   PCP:  Default, Provider, MD Pharmacy:   [id number] - [place], [place] - [address] [place] [address] Phone: [phone] Fax: [phone]     Social Determinants of Health (SDOH) Interventions    Readmission Risk Interventions No flowsheet data found.

## 2021-03-30 NOTE — Progress Notes (Signed)
Patient voices that her dad wants her to transfer to [facility] due to insurance coverage. RN notified attending MD Physician # [phone] Admission # [phone]

## 2021-03-30 NOTE — Progress Notes (Signed)
Initial Nutrition Assessment  DOCUMENTATION CODES:   Obesity unspecified  INTERVENTION:   Tube feeding via Cortrak: Change to Jevity 1.2 at 65 ml/h (1560 ml per day) Provides 1872 kcal, 87 gm protein, 1279 ml free water daily  NUTRITION DIAGNOSIS:   Inadequate oral intake related to dysphagia as evidenced by NPO status.  GOAL:   Patient will meet greater than or equal to 90% of their needs  MONITOR:   TF tolerance, Labs, Diet advancement, PO intake  REASON FOR ASSESSMENT:   Consult Enteral/tube feeding initiation and management  ASSESSMENT:   50 yo female admitted with acute CVA. PMH includes obesity.  Patient with severe dysphagia, may require PEG per SLP notes.  14 French NG tube was placed 6/18. Received MD Consult for TF initiation and management. Has been receiving Osmolite 1.2 at 50 ml/h via NGT providing 1440 kcal, 67 gm protein, 984 ml free water daily.  NG tube removed prior to MBS today. Patient did poorly on MBS and will require Cortrak placement this afternoon.   Patient reports good intake at home. She ate 3 meals per day and snacks sometimes. Her weight was stable and she has no known PMH. She is ready to have the NG tube removed because it is uncomfortable.   Labs reviewed.  CBG: 148-145  Medications reviewed and include Protonix.  No weight history available for review.   NUTRITION - FOCUSED PHYSICAL EXAM:  Flowsheet Row Most Recent Value  Orbital Region No depletion  Upper Arm Region No depletion  Thoracic and Lumbar Region No depletion  Buccal Region No depletion  Temple Region No depletion  Clavicle Bone Region No depletion  Clavicle and Acromion Bone Region No depletion  Scapular Bone Region No depletion  Dorsal Hand No depletion  Patellar Region No depletion  Anterior Thigh Region No depletion  Posterior Calf Region No depletion  Edema (RD Assessment) None  Hair Reviewed  Eyes Reviewed  Mouth Reviewed  Skin Reviewed  Nails  Reviewed       Diet Order:   Diet Order             Diet NPO time specified  Diet effective now                   EDUCATION NEEDS:   No education needs have been identified at this time  Skin:  Skin Assessment: Reviewed RN Assessment  Last BM:  6/17  Height:   Ht Readings from Last 1 Encounters:  03/26/21 5\' 4"  (1.626 m)    Weight:   Wt Readings from Last 1 Encounters:  03/30/21 89.6 kg    Ideal Body Weight:  54.5 kg  BMI:  Body mass index is 33.92 kg/m.  Estimated Nutritional Needs:   Kcal:  1700-1900  Protein:  85-100 gm  Fluid:  >/= 1.8 L    04/01/21, RD, LDN, CNSC Please refer to Amion for contact information.

## 2021-03-30 NOTE — Progress Notes (Signed)
STROKE TEAM PROGRESS NOTE   INTERVAL HISTORY Patient continues to have dysphagia and has NG tube  for medications and nutrition.  She states her speech and facial droop have recovered back to baseline.  She plans on having modified barium swallow done later this morning by speech therapy.  She has no new complaints.  Neurological exam is unchanged.    PERTINENT IMAGING MRA Neck Poor extracranial carotid arteries are tortuous but no hemodynamically significant stenosis.  Both vertebral arteries are patent  MRA Head Normal intracranial MRA.  2D Echo 17Jun2022 Left ventricular ejection fraction, by estimation, is 60 to 65%. The  left ventricle has normal function. The left ventricle has no regional  wall motion abnormalities. There is moderate asymmetric left ventricular hypertrophy. Left ventricular diastolic parameters were normal.   Right ventricular systolic function is normal. The right ventricular size is normal.  The mitral valve is normal in structure. Trivial mitral valve regurgitation. Moderate mitral annular calcification.  The aortic valve is normal in structure. Aortic valve regurgitation is not visualized. No aortic stenosis is present.   Carotid US 17Jun2022. Right Carotid: The extracranial vessels were near-normal with only minimal  wall thickening or plaque.  Left Carotid: The extracranial vessels were near-normal with only minimal  wall thickening or plaque.  Vertebrals:  Bilateral vertebral arteries demonstrate antegrade flow.  Subclavians: Normal flow hemodynamics were seen in bilateral subclavian            arteries.   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 No mass or abnormal enhancement  CT Head 18Jun2022 Negative noncontrast CT appearance of the brain.  Vitals:   03/30/21 0006 03/30/21 0402 03/30/21 0741 03/30/21 1205  BP: (!) 178/101 (!) 182/90 (!) 181/87 (!) 172/94   Pulse: 88 90 84 98  Resp: 18 18 16 16   Temp: 98.6 F (37 C) 98.8 F (37.1 C) 98.6 F (37 C) 98.9 F (37.2 C)  TempSrc: Oral Oral Oral Oral  SpO2: 100% 96% 98% 96%  Weight:  89.6 kg    Height:        CBC:  Recent Labs  Lab 03/27/21 0547 03/27/21 0618 03/28/21 0623  WBC 10.5  --  8.3  NEUTROABS 7.5  --   --   HGB 14.3 15.6* 13.3  HCT 45.3 46.0 41.6  MCV 83.0  --  82.1  PLT 329  --  284    Basic Metabolic Panel:  Recent Labs  Lab 03/29/21 0046 03/30/21 0949  NA 141 141  K 3.5 4.1  CL 106 109  CO2 24 26  GLUCOSE 105* 148*  BUN 13 17  CREATININE 0.81 0.86  CALCIUM 8.9 9.4  MG  --  2.2    Lipid Panel:  Recent Labs  Lab 03/28/21 0623  CHOL 158  TRIG 132  HDL 41  CHOLHDL 3.9  VLDL 26  LDLCALC 91   HgbA1c:  Lab Results  Component Value Date   HGBA1C 5.6 03/28/2021   Urine Drug Screen:     Component Value Date/Time   LABOPIA NONE DETECTED 03/28/2021 1637   COCAINSCRNUR NONE DETECTED 03/28/2021 1637   LABBENZ NONE DETECTED 03/28/2021 1637   AMPHETMU NONE DETECTED 03/28/2021 1637   THCU NONE DETECTED 03/28/2021 1637   LABBARB NONE DETECTED 03/28/2021 1637    Alcohol Level     Component Value Date/Time   ETH <10 03/27/2021 0547    EXAMINATION   HEENT-  Normocephalic, no  lesions, without obvious abnormality.  Normal external eye and conjunctiva.   Cardiovascular- well perfused Lungs-Breathing comfortably on room air Abdomen- soft Musculoskeletal-no obvious abnormality Skin-warm and dry, intact  Neurologic Exam: General: NAD, pleasant Mental Status: Alert, oriented, thought content appropriate.  Speech fluent without evidence of aphasia.  Able to follow commands without difficulty. Cranial Nerves: II:  Visual fields grossly normal, pupils equal, round, reactive to light and accommodation III,IV, VI: ptosis not present, extra-ocular motions intact bilaterally but there is saccadic dysmetria on lateral gaze (right. V,VII: smile symmetric,  facial light touch sensation normal bilaterally VIII: hearing normal bilaterally IX,X: uvula rises symmetrically XI: bilateral shoulder shrug XII: tongue with slight left-shift on extension, without atrophy or fasciculations. She appears to have full strength and mobility of the tongue. Motor: Right : Upper extremity   5/5    Left:     Upper extremity   5/5  Lower extremity   5/5     Lower extremity   5/5 Tone and bulk:normal tone throughout; no atrophy noted Sensory: Light touch sensation "different" (not described as reduced) on the left compared to the right. RUE>RLE. Cerebellar: Normal finger-to-nose,no truncal ataxia when sitting Gait: Deferred   ASSESSMENT/PLAN Tanya Nichols is a 50 y.o. healthy female who presented to [facility]  for evaluation of generalized weakness, right arm tingling, difficulty speaking and difficulty swallowing. She first noticed symptoms on 15Jun with the feeling that her vision was "off", her visual perception of her surroundings being "slanted" diagonally. She then had multiple episodes of N/V, in addition to lightheadedness, swaying vertigo and difficulty walking. Her mouth seemed not to be moving normally when she was talking. On 16Jun she developed additional symptoms of numbness and tingling to her face, arm and leg. She has not had any weakness. She has also had trouble swallowing.  Tanya Nichols is still experiencing sensory changes in the left arm, as well as trouble swallowing. Additional investigation into her history notes that she has recently begun a trampoline-based exercise program. She recently noted some sharp high-neck pain associated with exercise. She does not have a history of lupus, illicit drug use, miscarriage, OCP use, does not visit a chiropractor.  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 Law's age and previous medical history into consideration makes precise etiology unclear. Tanya Nichols has mild to moderate microvascular changes on imaging. CTA head was negative. Carotid US negative. TTE showed moderate asymmetric left ventricular hypertrophy which points to longer-standing uncontrolled hypertension as an etiology for stroke. Additional workup is needed: - CTA neck (ordered) - Labs pending: Cardiolipin antibodies, ANA, UDS, HgbA1c - LDL is 91, goal <70; Continue lipitor 40mg  daily - DAPT: 81mg  ASA + Plavix 75 mg daily into 3 weeks followed by aspirin alone.  40mg  lipitor daily - Permissive hypertension  #DVT ppx Patient is ambulatory, no SCDs - On lovenox 40mg  daily  #HTN Ms. Zorn does not have a history of HTN but BPs have been 160s-190s/80s-100s  in the hospital. - Trend BPs - Continue Cozaar 50 mg daily - Permissive hypertension OK (< 220/120) but gradually normalize in 5-7 days)  #Dispo Pending above labs and imaging - PT/OT suggests outpatient therapy follow-up - SLP reassessment appreciated - Home when medically appropriate.  Hospital day # 3    Patient presented with  sudden onset of nausea vomiting and ataxia due to left pontomedullary junction infarct etiology likely small vessel disease as MRA of the neck has ruled out vertebral dissection.  She has multiple newly identified risk factors in the form of obesity, hypertension, hyperlipidemia and she appears to be at risk for sleep apnea.  Neurological exam is unremarkable except for mild saccadic dysmetria on lateral gaze.  She is also has some subjective paresthesias but no objective sensory loss.  She has significant dysphagia and has a NG tube for nutrition.  She may consider possible participation in the sleep smart study for stroke prevention if interested given information to review and decide.   Recommend modified barium swallow by speech therapy today for her dysphagia.  Continue tube feeds for now..  Discussed with patient and speech therapist greater than 50% time during this 25-minute visit was spent in counseling and coordination of care about stroke and discussion with care team and answering questions.  Delia Heady, MD Medical Director [facility] Pager: [phone] 03/30/2021 4:34 PM

## 2021-03-31 ENCOUNTER — Inpatient Hospital Stay (HOSPITAL_COMMUNITY): Payer: BLUE CROSS/BLUE SHIELD

## 2021-03-31 LAB — CARDIOLIPIN ANTIBODIES, IGG, IGM, IGA
Anticardiolipin IgA: <9 APL U/mL (ref 0–11)
Anticardiolipin IgG: <9 GPL U/mL (ref 0–14)
Anticardiolipin IgM: <9 MPL U/mL (ref 0–12)

## 2021-03-31 LAB — GLUCOSE, CAPILLARY
Glucose-Capillary: 116 mg/dL — ABNORMAL HIGH (ref 70–99)
Glucose-Capillary: 88 mg/dL (ref 70–99)

## 2021-03-31 IMAGING — CT CT ABDOMEN W/O CM
2 of 4 series · 16 of 46 positions shown, 18 images · non-contrast
Comparison: None

CLINICAL DATA: Unintended weight loss, nasogastric tube placement,
history of stroke

EXAM:
CT ABDOMEN WITHOUT CONTRAST
TECHNIQUE: Multidetector CT imaging of the abdomen was performed following the
standard protocol without IV contrast. Sagittal and coronal MPR
images reconstructed from axial data set.

[Series 3: abd/ pelvis 5.0 i30f 2 · axial · 0.74mm/px · z∈[+980,+1230]mm · 13 of 56 slices shown, 15 images]
[im 3/56  soft-tissue]
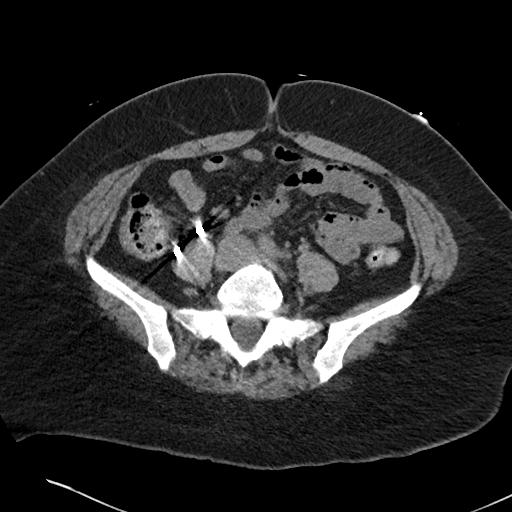
[im 3/56  bone]
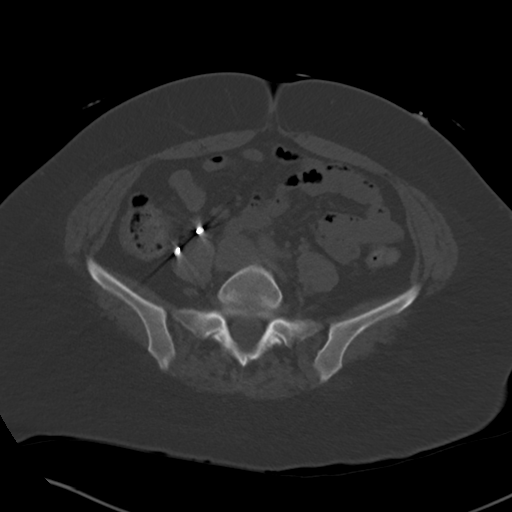
[im 8/56  soft-tissue]
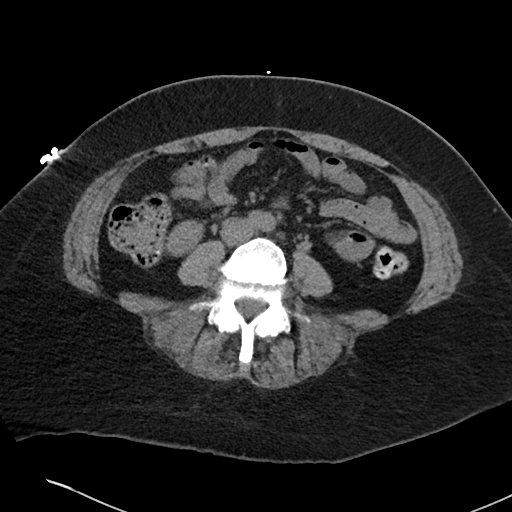
[im 11/56  soft-tissue]
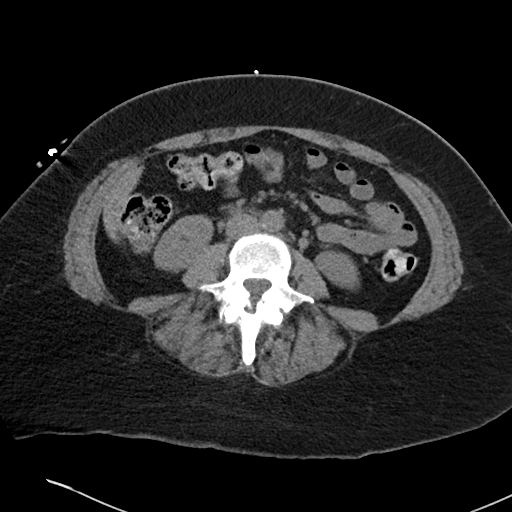
[im 16/56  soft-tissue]
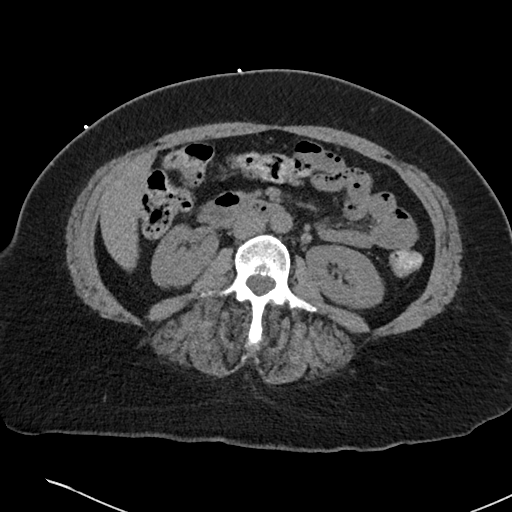
[im 19/56  soft-tissue]
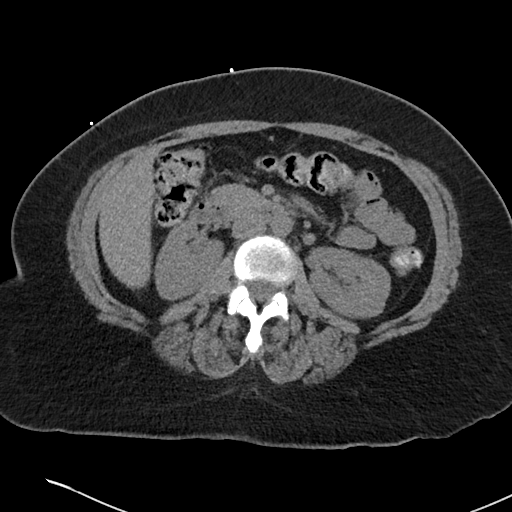
[im 24/56  soft-tissue]
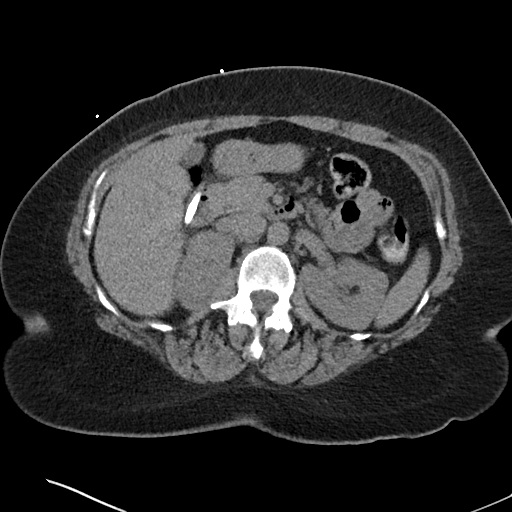
[im 29/56  soft-tissue]
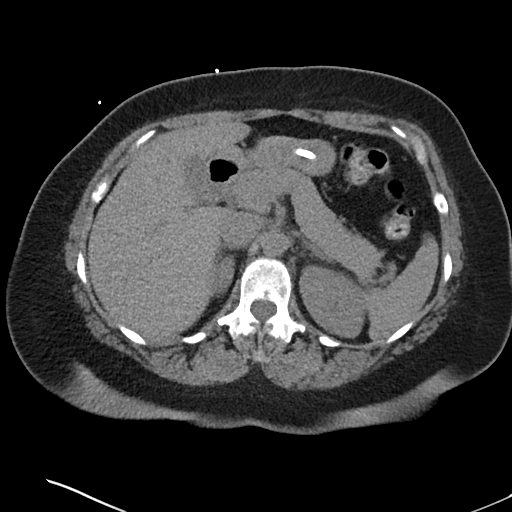
[im 32/56  soft-tissue]
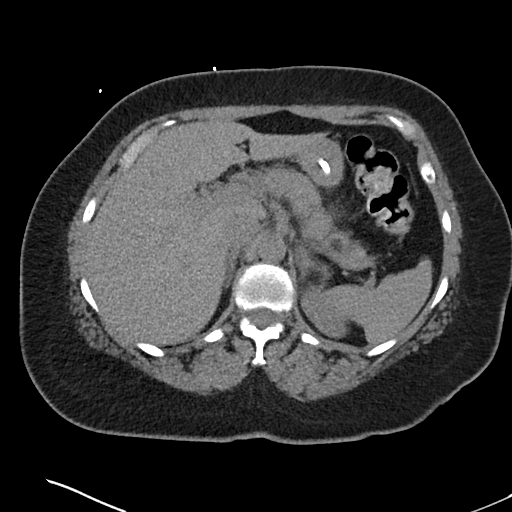
[im 37/56  soft-tissue]
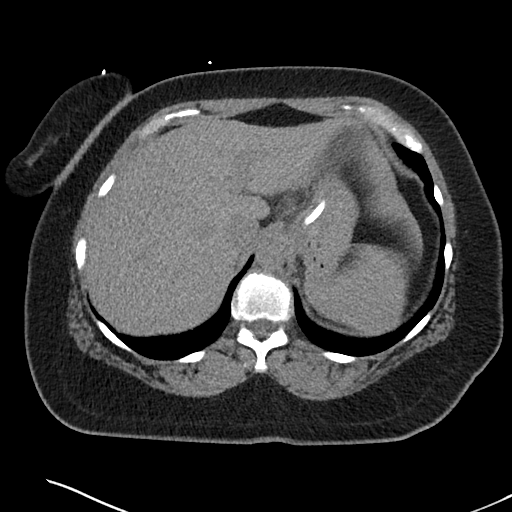
[im 37/56  bone]
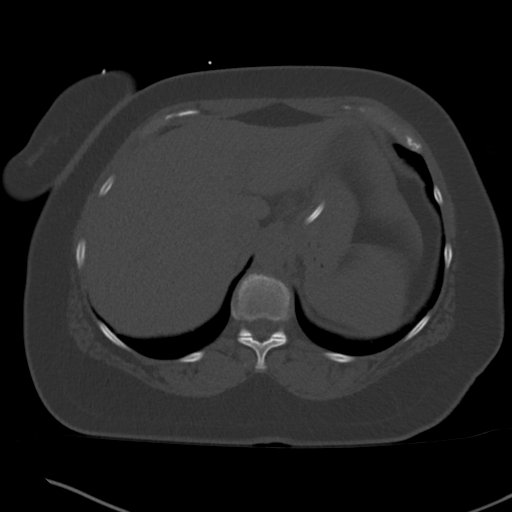
[im 40/56  soft-tissue]
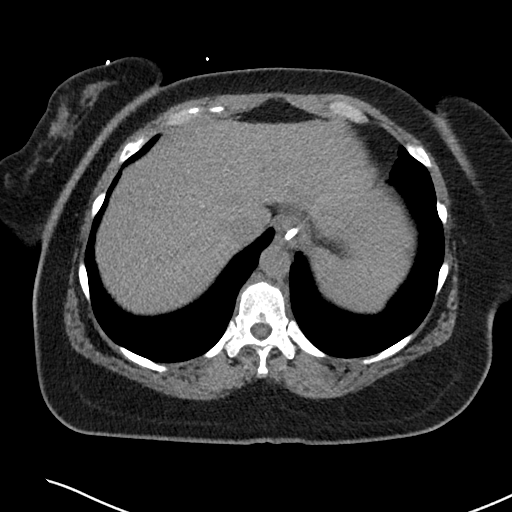
[im 45/56  soft-tissue]
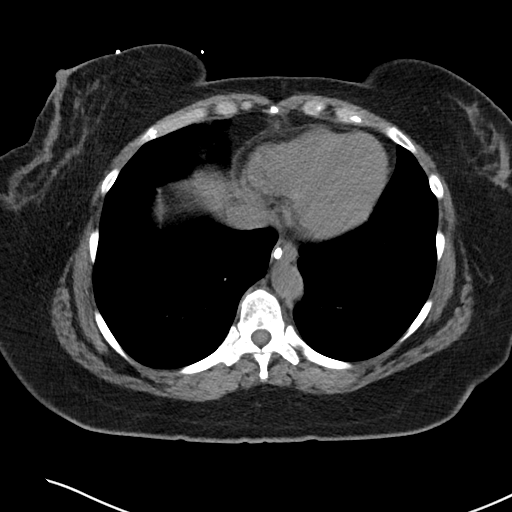
[im 48/56  soft-tissue]
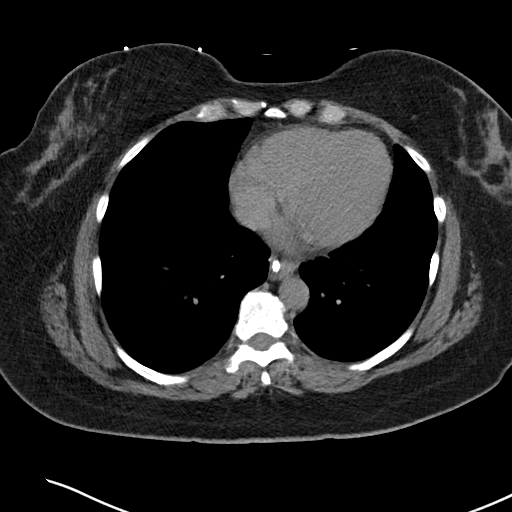
[im 53/56  soft-tissue]
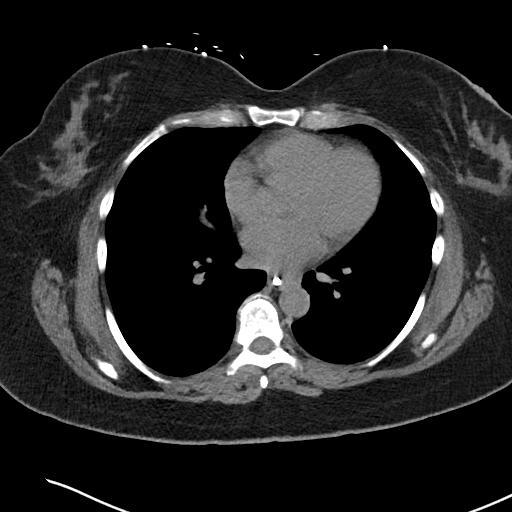

[Series 6: cor st · coronal · 0.57mm/px · 3 of 101 slices shown]
[im 34/101  soft-tissue]
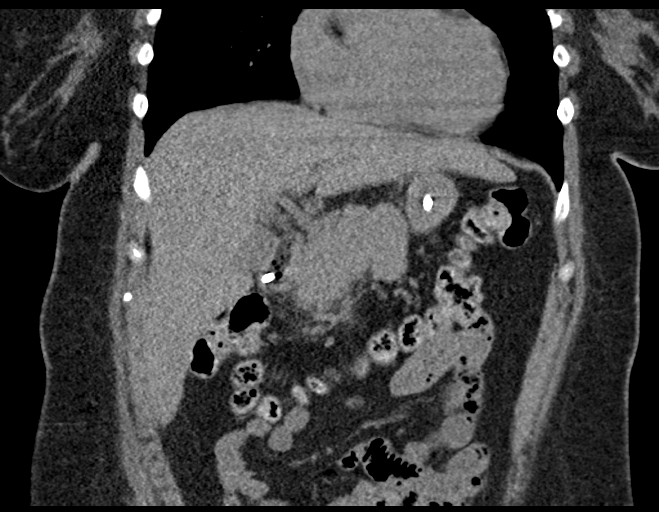
[im 45/101  soft-tissue]
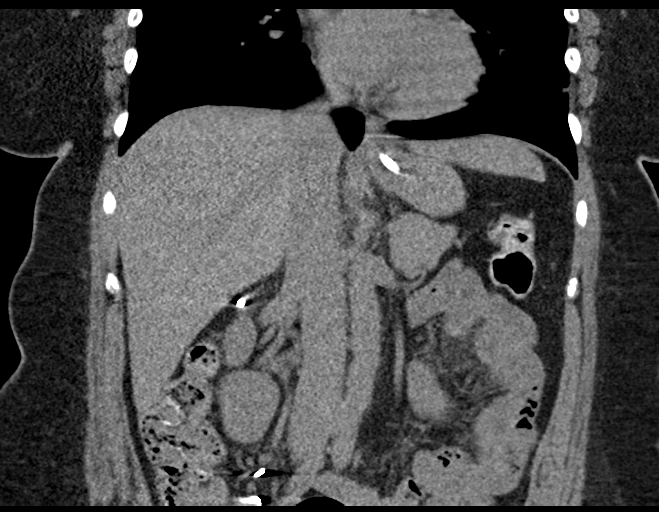
[im 56/101  soft-tissue]
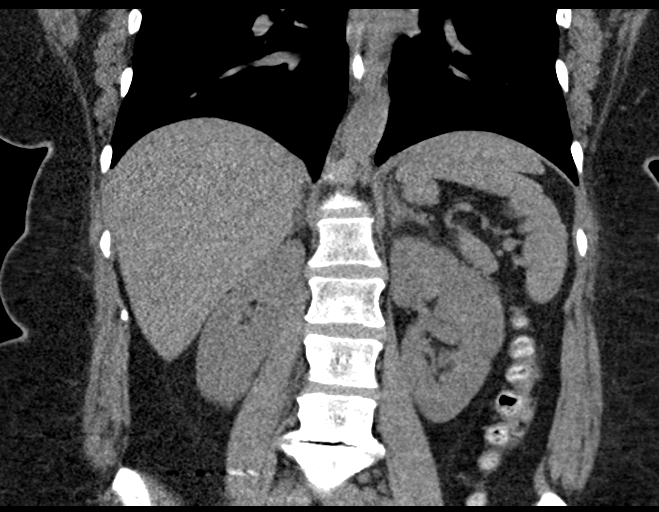

[16 of 46 positions shown; findings below may reference images not displayed]

FINDINGS: Lower chest: Lung bases clear

Hepatobiliary: Gallbladder and liver normal appearance

Pancreas: Normal appearance

Spleen: Normal appearance

Adrenals/Urinary Tract: Adrenal glands, kidneys, and visualized
ureters normal appearance

Stomach/Bowel: Tip of nasogastric tube projects over second portion
of duodenum. Stomach and visualized bowel loops unremarkable.

Vascular/Lymphatic: Aorta normal caliber.  No adenopathy.

Other: No free air or free fluid. No hernia. Surgical clips RIGHT
lower quadrant.

Musculoskeletal: Grade 2 spondylolisthesis L4-L5 secondary to
BILATERAL spondylolysis L4.
IMPRESSION: Grade 2 spondylolisthesis L4-L5 secondary to BILATERAL spondylolysis
L4.

Tip of nasogastric tube projects over second portion of duodenum.

No acute intra-abdominal abnormalities.

## 2021-03-31 NOTE — Care Management (Addendum)
Entered a benefit check to see if patient's insurance is in network with [employer].   Called patient. Patient provided Archie Patten Case Manager [phone]   NCM called Noreene Larsson directly and explained case   Noreene Larsson has NCM number . She will "work on this and call NCM back."  1212 Noreene Larsson with [employer] called back with her supervisor on phone Harriett Sine). Per Harriett Sine since patient came to [facility] for an emergency the ED vist will be paid for at in network benefit. Also since the emergency ( CVA) she needed to be admitted the initial inpatient stay will be covered with in network benefits.   [facility] case managers send clinical in to [employer] to review, if at any point [employer] thinks patient needs to be transferred to a in network hospital , [employer] will notify [facility] Department.   NCM asked Harriett Sine to call patient directly to explain above. Harriett Sine will call Sharran.   1250 Confirmed with Justice that Webb and Harriett Sine from [place] called her and explained above. However, patient still asking when she will be transferred to [facility]. NCM explained above to patient. Patient voiced understanding. Ozzie requesting NCM to call her father and explain. NCM instructed Conna to give her father Jill's number and he can discuss benefits directly with Noreene Larsson at [place], [place] also encouraged patient to call Noreene Larsson with any further questions regarding her [employer] benefits. Patient voiced understanding and will have her father call Noreene Larsson.

## 2021-03-31 NOTE — Progress Notes (Signed)
  Speech Language Pathology Treatment: Dysphagia  Patient Details Name: Tanya Nichols MRN: [id number] DOB: 02-Sep-1971 Today's Date: 03/31/2021 Time: 3893-7342 SLP Time Calculation (min) (ACUTE ONLY): 15 min  Assessment / Plan / Recommendation Clinical Impression  Ongoing f/u for dysphagia management. Pt reports improved ability to manage her secretions; she slept last night without waking up and coughing on her saliva.  Today she tried sips of water, ice chips and several spoons of applesauce, describing some improved transition of applesauce, but continued overall residue, extensive coughing with water, and ultimate expectoration of all PO trials.  Positional changes (reclining) can offer some improvements in bolus flow - this was attempted but elicited no clinical improvement.  Pt was taught some pharyngeal-focused exercises in an effort to stimulate that muscle region. They were difficult to execute but pt worked with great effort.  She agrees to practice the [employer] and masako maneuvers to target laryngeal elevation, pharyngeal contraction.  SLP will continue to follow.  Pt may try ice chips, water, applesauce at her discretion.  Continue general NPO/cortrak feeds.  PEG may be pending according to MD notes.   HPI HPI: 50 yo F w/ no known PMH presenting to [facility] with dysarthria, dysphagia, facial droop, nausea, vomiting. She was in her usual state of health until 03/25/21 at 10am when she developed acute onset vertigo with nausea and vomiting. MRI showed acute infarct at left pontomedullary junction. Pt failed [employer] screen.      SLP Plan  Continue with current plan of care       Recommendations  Diet recommendations: NPO (pt may try ice chips and applesauce at her discretion) Medication Administration: Via alternative means Supervision: Patient able to self feed                Oral Care Recommendations: Oral care QID Follow up Recommendations: Outpatient SLP SLP  Visit Diagnosis: Dysphagia, pharyngeal phase (R13.13) Plan: Continue with current plan of care       GO              [name], MA CCC/SLP Acute Rehabilitation Services Office number [phone] Pager [phone]  [name] 03/31/2021, 12:19 PM

## 2021-03-31 NOTE — Progress Notes (Signed)
   Subjective:   Tanya Nichols states she feels her swallowing is improved somewhat, as she is choking less on her saliva.   We discussed the MBS results and the next steps, including PEG. She is not opposed to this option.   We discussed transfer as well. Tanya Nichols states she would still like to transfer for insurance coverage purposes. Discussed that we will have SW help with this.  Objective:  Vital signs in last 24 hours: Vitals:   03/30/21 1831 03/30/21 1952 03/30/21 2355 03/31/21 0407  BP: (!) 169/90 (!) 158/93 (!) 160/89 (!) 156/93  Pulse: 98 96 90 81  Resp: 16 14 17 18   Temp: 98.3 F (36.8 C) 99.8 F (37.7 C) 98.4 F (36.9 C) 98.6 F (37 C)  TempSrc: Oral Oral Oral Oral  SpO2: 98% 100% 97% 95%  Weight:    89.9 kg  Height:       Physical Exam: General: Obese, middle-aged female, standing up during encounter, NAD. Pulm: Normal work of breathing, no respiratory distress noted. Neuro: AAOx4, strength 5 out of 5 bilaterally.  Sensation has improved over right face but continues to be sporadically diminished throughout right upper and lower extremities albeit with some improvement since yesterday. Psych: Calm and cooperative.  Assessment/Plan:  Active Problems:   CVA (cerebral vascular accident) (HCC)   Acute CVA (cerebrovascular accident) (HCC)  Small acute L pontomedullary CVA Patient continues to have profound dysphagia.  Modified barium swallow performed yesterday by SLP showing continued severe pharyngeal dysphagia and patient remains at severe aspiration risk.  Unfortunately, it appears that patient will likely need a PEG tube for long-term nutrition prior to discharge.  Outpatient follow-up with SLP recommended.  Otherwise, patient's right-sided sensory deficits appear to sporadically improve but she does continue to have some areas that have blunted sensation.  Patient would like to be transferred over to [facility] as her insurance would provide better  coverage there.  We would need to see verification of this via fax.  Social work made aware. -Continue aspirin, Lipitor for secondary prevention -HgbA1c 5.6% -Cortrak placed yesterday -will need to plan for PEG tube but unclear when given potential transfer -PT/OT recommending outpatient vestibular PT  HTN Patient's blood pressures improved after increasing cozaar yesterday. Will hold off on initiating HCTZ at this time as patient is unable to tolerate PO intake (has cortrak in place at this time). -Cozaar 100 mg daily per tube  Prior to Admission Living Arrangement: Home Anticipated Discharge Location: TBD Barriers to Discharge: continued medical management Dispo: Anticipated discharge in approximately 2-3  day(s).   [facility], MD Pager: [phone] 03/31/2021, 7:50 AM

## 2021-03-31 NOTE — Progress Notes (Addendum)
Physical Therapy Treatment/Discharge Patient Details Name: Tanya Nichols MRN: [id number] DOB: [birth year] Today's Date: 03/31/2021    History of Present Illness 50 y.o. female presents to [facility] ED on 03/26/2021 with reports of nausea, vomiting, RUE numbness/tingling, dysphagia, and dysarthria. MRI demonstrates acute pontomedullary junction infarct. Pt reports insignificant PMH.    PT Comments    Pt independent with gait in the hallway.  Very mild balance deficits noted which pt reports is her baseline.  X1 gaze stability exercises given (vertical is a bit more symptomatic than horizontal) and pt reports walking independently in the hallway several times per day.  PT will defer all further therapy to the OP PT setting and sign off acutely.   [facility] HEP: [id number]  Follow Up Recommendations  Outpatient PT     Equipment Recommendations  None recommended by PT    Recommendations for Other Services       Precautions / Restrictions Precautions Precautions: None    Mobility  Bed Mobility Overal bed mobility: Independent                  Transfers Overall transfer level: Independent                  Ambulation/Gait Ambulation/Gait assistance: Independent Gait Distance (Feet): 500 Feet Assistive device: None Gait Pattern/deviations: Step-through pattern;Staggering right;Staggering left     General Gait Details: Pt with mildly staggering gait pattern, reports she was not coordinated PTA.  Appears gait has improved since last PT session and pt reports walking on her own several times per day.   Stairs             Wheelchair Mobility    Modified Rankin (Stroke Patients Only)       Balance Overall balance assessment: Needs assistance Sitting-balance support: Feet supported;No upper extremity supported Sitting balance-Leahy Scale: Good     Standing balance support: No upper extremity supported Standing balance-Leahy Scale: Good   Single Leg Stance  - Right Leg: 5 Single Leg Stance - Left Leg: 5 Tandem Stance - Right Leg: 20 Tandem Stance - Left Leg: 20   Rhomberg - Eyes Closed: 30 (or greater stopped at 30) High level balance activites: Backward walking;Direction changes;Turns;Sudden stops;Head turns (some mild sway, but self corrects)   Standardized Balance Assessment Standardized Balance Assessment : Dynamic Gait Index   Dynamic Gait Index Level Surface: Normal Change in Gait Speed: Normal Gait with Horizontal Head Turns: Mild Impairment Gait with Vertical Head Turns: Normal Gait and Pivot Turn: Normal Step Over Obstacle: Normal Step Around Obstacles: Normal Steps: Mild Impairment Total Score: 22      Cognition Arousal/Alertness: Awake/alert Behavior During Therapy: WFL for tasks assessed/performed Overall Cognitive Status: Impaired/Different from baseline Area of Impairment: Awareness                           Awareness: Anticipatory   General Comments: Some mild decreased awareness of deficits      Exercises Other Exercises Other Exercises: x1 seated vertical and horizontal gaze stability exercises. Handout and targets given and reviewed.    General Comments        Pertinent Vitals/Pain Pain Assessment: No/denies pain    Home Living                      Prior Function            PT Goals (current goals can now be found in the care  plan section) Acute Rehab PT Goals Patient Stated Goal: to be able to cook and drive PT Goal Formulation: All assessment and education complete, DC therapy Progress towards PT goals: Goals met/education completed, patient discharged from PT    Frequency    Min 4X/week      PT Plan Other (comment) (d/c from acute therapy defer all further therapy to OP)    Co-evaluation              AM-PAC PT "6 Clicks" Mobility   Outcome Measure  Help needed turning from your back to your side while in a flat bed without using bedrails?: None Help  needed moving from lying on your back to sitting on the side of a flat bed without using bedrails?: None Help needed moving to and from a bed to a chair (including a wheelchair)?: None Help needed standing up from a chair using your arms (e.g., wheelchair or bedside chair)?: None Help needed to walk in hospital room?: None Help needed climbing 3-5 steps with a railing? : None 6 Click Score: 24    End of Session   Activity Tolerance: Patient tolerated treatment well Patient left: in bed;with call bell/phone within reach;with nursing/sitter in room   PT Visit Diagnosis: Unsteadiness on feet (R26.81);Other symptoms and signs involving the nervous system (F81.829)     Time: 9371-6967 PT Time Calculation (min) (ACUTE ONLY): 24 min  Charges:  $Gait Training: 23-37 mins                     Verdene Lennert, PT, DPT  Acute Rehabilitation Ortho Tech Supervisor [phone] pager [phone]) [phone] office

## 2021-03-31 NOTE — Progress Notes (Signed)
STROKE TEAM PROGRESS NOTE   INTERVAL HISTORY Patient continues to have dysphagia and has cortrack tube  for medications and nutrition.  She states her swallowing is subjectively better today but objectively she continues to do poorly with speech therapy.  Evaluation.  She did not do well yesterday when modified barium swallow done   by speech therapy.  She has no new complaints.  Neurological exam is unchanged.  She did participate in the sleep smart study and tested positive for sleep apnea on the overnight NOx 3 monitor  PERTINENT IMAGING MRA Neck Poor extracranial carotid arteries are tortuous but no hemodynamically significant stenosis.  Both vertebral arteries are patent  MRA Head Normal intracranial MRA.  2D Echo 17Jun2022 Left ventricular ejection fraction, by estimation, is 60 to 65%. The  left ventricle has normal function. The left ventricle has no regional  wall motion abnormalities. There is moderate asymmetric left ventricular hypertrophy. Left ventricular diastolic parameters were normal.   Right ventricular systolic function is normal. The right ventricular size is normal.  The mitral valve is normal in structure. Trivial mitral valve regurgitation. Moderate mitral annular calcification.  The aortic valve is normal in structure. Aortic valve regurgitation is not visualized. No aortic stenosis is present.   Carotid US 17Jun2022. Right Carotid: The extracranial vessels were near-normal with only minimal  wall thickening or plaque.  Left Carotid: The extracranial vessels were near-normal with only minimal  wall thickening or plaque.  Vertebrals:  Bilateral vertebral arteries demonstrate antegrade flow.  Subclavians: Normal flow hemodynamics were seen in bilateral subclavian            arteries.   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 No mass or abnormal enhancement  CT Head  18Jun2022 Negative noncontrast CT appearance of the brain.  Vitals:   03/30/21 2355 03/31/21 0407 03/31/21 0811 03/31/21 1310  BP: (!) 160/89 (!) 156/93 (!) 165/95 (!) 168/94  Pulse: 90 81 86 99  Resp: 17 18 18 18   Temp: 98.4 F (36.9 C) 98.6 F (37 C) 98.8 F (37.1 C) 98.7 F (37.1 C)  TempSrc: Oral Oral Oral Oral  SpO2: 97% 95% 99% 99%  Weight:  89.9 kg    Height:        CBC:  Recent Labs  Lab 03/27/21 0547 03/27/21 0618 03/28/21 0623  WBC 10.5  --  8.3  NEUTROABS 7.5  --   --   HGB 14.3 15.6* 13.3  HCT 45.3 46.0 41.6  MCV 83.0  --  82.1  PLT 329  --  284    Basic Metabolic Panel:  Recent Labs  Lab 03/29/21 0046 03/30/21 0949  NA 141 141  K 3.5 4.1  CL 106 109  CO2 24 26  GLUCOSE 105* 148*  BUN 13 17  CREATININE 0.81 0.86  CALCIUM 8.9 9.4  MG  --  2.2    Lipid Panel:  Recent Labs  Lab 03/28/21 0623  CHOL 158  TRIG 132  HDL 41  CHOLHDL 3.9  VLDL 26  LDLCALC 91   HgbA1c:  Lab Results  Component Value Date   HGBA1C 5.6 03/28/2021   Urine Drug Screen:     Component Value Date/Time   LABOPIA NONE DETECTED 03/28/2021 1637   COCAINSCRNUR NONE DETECTED 03/28/2021 1637   LABBENZ NONE DETECTED 03/28/2021 1637   AMPHETMU NONE DETECTED 03/28/2021 1637   THCU NONE DETECTED 03/28/2021 1637   LABBARB NONE DETECTED 03/28/2021  1637    Alcohol Level     Component Value Date/Time   ETH <10 03/27/2021 0547    EXAMINATION   HEENT-  Normocephalic, no lesions, without obvious abnormality.  Normal external eye and conjunctiva.   Cardiovascular- well perfused Lungs-Breathing comfortably on room air Abdomen- soft Musculoskeletal-no obvious abnormality Skin-warm and dry, intact  Neurologic Exam: General: NAD, pleasant Mental Status: Alert, oriented, thought content appropriate.  Speech fluent without evidence of aphasia.  Able to follow commands without difficulty. Cranial Nerves: II:  Visual fields grossly normal, pupils equal, round, reactive to  light and accommodation III,IV, VI: ptosis not present, extra-ocular motions intact bilaterally but there is saccadic dysmetria on lateral gaze (right. V,VII: smile symmetric, facial light touch sensation normal bilaterally VIII: hearing normal bilaterally IX,X: uvula rises symmetrically XI: bilateral shoulder shrug XII: tongue with slight left-shift on extension, without atrophy or fasciculations. She appears to have full strength and mobility of the tongue. Motor: Right : Upper extremity   5/5    Left:     Upper extremity   5/5  Lower extremity   5/5     Lower extremity   5/5 Tone and bulk:normal tone throughout; no atrophy noted Sensory: Light touch sensation "different" (not described as reduced) on the left compared to the right. RUE>RLE. Cerebellar: Normal finger-to-nose,no truncal ataxia when sitting Gait: Deferred   ASSESSMENT/PLAN [name] is a 50 y.o. healthy female who presented to [facility]  for evaluation of generalized weakness, right arm tingling, difficulty speaking and difficulty swallowing. She first noticed symptoms on 15Jun with the feeling that her vision was "off", her visual perception of her surroundings being "slanted" diagonally. She then had multiple episodes of N/V, in addition to lightheadedness, swaying vertigo and difficulty walking. Her mouth seemed not to be moving normally when she was talking. On 16Jun she developed additional symptoms of numbness and tingling to her face, arm and leg. She has not had any weakness. She has also had trouble swallowing.  Tanya Nichols is still experiencing sensory changes in the left arm, as well as trouble swallowing. Additional investigation into her history notes that she has recently begun a trampoline-based exercise program. She recently noted some sharp high-neck pain associated with exercise. She does not have a history of lupus, illicit drug use, miscarriage, OCP use, does not visit a chiropractor.  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 Tanya Nichols has mild to moderate microvascular changes on imaging. CTA head was negative. Carotid US negative. TTE showed moderate asymmetric left ventricular hypertrophy which points to longer-standing uncontrolled hypertension as an etiology for stroke. Additional workup is needed: - CTA neck (ordered) - Labs pending: Cardiolipin antibodies, ANA, UDS, HgbA1c - LDL is 91, goal <70; Continue lipitor 40mg  daily - DAPT: 81mg  ASA + Plavix 75 mg daily into 3 weeks followed by aspirin alone.  40mg  lipitor daily - Permissive hypertension  #DVT ppx Patient is ambulatory, no SCDs - On lovenox 40mg  daily  #HTN Tanya Nichols does not have a history of HTN but BPs have been 160s-190s/80s-100s  in the hospital. - Trend BPs - Continue Cozaar 50 mg daily - Permissive hypertension OK (< 220/120) but gradually normalize in 5-7 days)  #Dispo Pending above  labs and imaging - PT/OT suggests outpatient therapy follow-up - SLP reassessment appreciated - Home when medically appropriate.  Hospital day # 4      She has persistent significant dysphagia and has a core track tube for nutrition.  She is participating in the sleep smart study for stroke prevention .  She is tested positive for sleep apnea on the overnight NOx 3 monitor and will undergo CPAP mask titration tolerability test tonight.  Recommend continue ongoing speech therapy.  Continue tube feeds for now..  Discussed with patient and speech therapist if her dysphagia does not improve over the next couple of days schedule PEG tube on Friday.  Discussed with Dr. Mikey Bussing.  Greater than  50% time during this 25-minute visit was spent in counseling and coordination of care about stroke and discussion with care team and answering questions.  Delia Heady, MD Medical Director [facility] Stroke Center Pager: [phone] 03/31/2021 3:02 PM

## 2021-04-01 LAB — GLUCOSE, CAPILLARY
Glucose-Capillary: 109 mg/dL — ABNORMAL HIGH (ref 70–99)
Glucose-Capillary: 110 mg/dL — ABNORMAL HIGH (ref 70–99)
Glucose-Capillary: 141 mg/dL — ABNORMAL HIGH (ref 70–99)

## 2021-04-01 LAB — ANA W/REFLEX IF POSITIVE: Anti Nuclear Antibody (ANA): NEGATIVE

## 2021-04-01 NOTE — Progress Notes (Signed)
  Speech Language Pathology Treatment: Dysphagia  Patient Details Name: Tanya Nichols MRN: [id number] DOB: 11/07/1970 Today's Date: 04/01/2021 Time: 6948-5462 SLP Time Calculation (min) (ACUTE ONLY): 15 min  Assessment / Plan / Recommendation Clinical Impression  Ongoing f/u for dysphagia management. Pt endorses marginal improvements in swallowing.  She has been practicing the Standish and Masako maneuvers constantly throughout the day, as well has some of her own exercises that she has devised.  She tried ice chips, water, and pudding with results similar to yesterday.  She describes the sensation of beginning to execute the swallow and then the process "just stops." She expectorates residue and coughs.  She does report some movement of pudding through her throat and esophagus.   Discussed situation with Dr. Pearlean Brownie today.  Plan is to repeat MBS tomorrow to determine any physiological improvements. If they are not sufficient to allow PO diet, pt will be NPO tomorrow after midnight and plan is for PEG on Friday.  Tanya Nichols understands circumstances and is hopeful for improvement.    SLP will continue to follow.  Pt may try ice chips, water, purees at her discretion.   HPI HPI: 50 yo F w/ no known PMH presenting to [facility] with dysarthria, dysphagia, facial droop, nausea, vomiting. She was in her usual state of health until 03/25/21 at 10am when she developed acute onset vertigo with nausea and vomiting. MRI showed acute infarct at left pontomedullary junction. Pt failed [employer] screen.      SLP Plan  MBS next date.       Recommendations  Diet recommendations: NPO (pt may try ice chips, purees at her discretion) Medication Administration: Via alternative means Supervision: Patient able to self feed                Oral Care Recommendations: Oral care QID Follow up Recommendations: Outpatient SLP SLP Visit Diagnosis: Dysphagia, pharyngeal phase (R13.13) Plan: MBS        GO               Tanya Nichols L. Samson Frederic, MA CCC/SLP Acute Rehabilitation Services Office number [phone] Pager [phone]  Tanya Nichols 04/01/2021, 4:41 PM

## 2021-04-01 NOTE — Progress Notes (Signed)
STROKE TEAM PROGRESS NOTE   INTERVAL HISTORY Patient continues to have dysphagia and has cortrack tube  for medications and nutrition.  She continues to state that her swallowing is subjectively better today but objectively she continues to do poorly as per speech therapy.  .  She has no new complaints.  Neurological exam is unchanged.  She did participate in the sleep smart study and and did try the CPAP mask overnight but due to the core track tube there was no right sitting and there was an air leak making steady nondiagnostic.  CPAP mask trial will be repeated once core track tube is removed. PERTINENT IMAGING MRA Neck Poor extracranial carotid arteries are tortuous but no hemodynamically significant stenosis.  Both vertebral arteries are patent  MRA Head Normal intracranial MRA.  2D Echo 17Jun2022 Left ventricular ejection fraction, by estimation, is 60 to 65%. The  left ventricle has normal function. The left ventricle has no regional  wall motion abnormalities. There is moderate asymmetric left ventricular hypertrophy. Left ventricular diastolic parameters were normal.   Right ventricular systolic function is normal. The right ventricular size is normal.  The mitral valve is normal in structure. Trivial mitral valve regurgitation. Moderate mitral annular calcification.  The aortic valve is normal in structure. Aortic valve regurgitation is not visualized. No aortic stenosis is present.   Carotid US 17Jun2022. Right Carotid: The extracranial vessels were near-normal with only minimal  wall thickening or plaque.  Left Carotid: The extracranial vessels were near-normal with only minimal  wall thickening or plaque.  Vertebrals:  Bilateral vertebral arteries demonstrate antegrade flow.  Subclavians: Normal flow hemodynamics were seen in bilateral subclavian            arteries.   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 Mild to moderate chronic  microvascular ischemic changes. No mass or abnormal enhancement  CT Head 18Jun2022 Negative noncontrast CT appearance of the brain.  Vitals:   04/01/21 0025 04/01/21 0500 04/01/21 0807 04/01/21 1208  BP: 140/76 (!) 164/90 (!) 169/94 (!) 159/87  Pulse: 84 80 77 86  Resp: 19 19 18 18   Temp: 98.4 F (36.9 C) 98.3 F (36.8 C) 98.3 F (36.8 C) 98.6 F (37 C)  TempSrc: Oral Oral Oral Oral  SpO2: 98% 97% 98% 97%  Weight:      Height:        CBC:  Recent Labs  Lab 03/27/21 0547 03/27/21 0618 03/28/21 0623  WBC 10.5  --  8.3  NEUTROABS 7.5  --   --   HGB 14.3 15.6* 13.3  HCT 45.3 46.0 41.6  MCV 83.0  --  82.1  PLT 329  --  284    Basic Metabolic Panel:  Recent Labs  Lab 03/29/21 0046 03/30/21 0949  NA 141 141  K 3.5 4.1  CL 106 109  CO2 24 26  GLUCOSE 105* 148*  BUN 13 17  CREATININE 0.81 0.86  CALCIUM 8.9 9.4  MG  --  2.2    Lipid Panel:  Recent Labs  Lab 03/28/21 0623  CHOL 158  TRIG 132  HDL 41  CHOLHDL 3.9  VLDL 26  LDLCALC 91   HgbA1c:  Lab Results  Component Value Date   HGBA1C 5.6 03/28/2021   Urine Drug Screen:     Component Value Date/Time   LABOPIA NONE DETECTED 03/28/2021 1637   COCAINSCRNUR NONE DETECTED 03/28/2021 1637   LABBENZ NONE DETECTED 03/28/2021 1637   AMPHETMU NONE DETECTED 03/28/2021  1637   THCU NONE DETECTED 03/28/2021 1637   LABBARB NONE DETECTED 03/28/2021 1637    Alcohol Level     Component Value Date/Time   ETH <10 03/27/2021 0547    EXAMINATION   HEENT-  Normocephalic, no lesions, without obvious abnormality.  Normal external eye and conjunctiva.   Cardiovascular- well perfused Lungs-Breathing comfortably on room air Abdomen- soft Musculoskeletal-no obvious abnormality Skin-warm and dry, intact  Neurologic Exam: General: NAD, pleasant Mental Status: Alert, oriented, thought content appropriate.  Speech fluent without evidence of aphasia.  Able to follow commands without difficulty. Cranial  Nerves: II:  Visual fields grossly normal, pupils equal, round, reactive to light and accommodation III,IV, VI: ptosis not present, extra-ocular motions intact bilaterally but there is saccadic dysmetria on lateral gaze (right. V,VII: smile symmetric, facial light touch sensation normal bilaterally VIII: hearing normal bilaterally IX,X: uvula rises symmetrically XI: bilateral shoulder shrug XII: tongue with slight left-shift on extension, without atrophy or fasciculations. She appears to have full strength and mobility of the tongue. Motor: Right : Upper extremity   5/5    Left:     Upper extremity   5/5  Lower extremity   5/5     Lower extremity   5/5 Tone and bulk:normal tone throughout; no atrophy noted Sensory: Light touch sensation "different" (not described as reduced) on the left compared to the right. RUE>RLE. Cerebellar: Normal finger-to-nose,no truncal ataxia when sitting Gait: Deferred   ASSESSMENT/PLAN [name] is a 50 y.o. healthy female who presented to [facility]  for evaluation of generalized weakness, right arm tingling, difficulty speaking and difficulty swallowing. She first noticed symptoms on 15Jun with the feeling that her vision was "off", her visual perception of her surroundings being "slanted" diagonally. She then had multiple episodes of N/V, in addition to lightheadedness, swaying vertigo and difficulty walking. Her mouth seemed not to be moving normally when she was talking. On 16Jun she developed additional symptoms of numbness and tingling to her face, arm and leg. She has not had any weakness. She has also had trouble swallowing.  Tanya Nichols is still experiencing sensory changes in the left arm, as well as trouble swallowing. Additional investigation into her history notes that she has recently begun a trampoline-based exercise program. She recently noted some sharp high-neck pain associated with exercise. She does not have a history of lupus, illicit drug  use, miscarriage, OCP use, does not visit a chiropractor.  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 age and previous medical history into consideration makes precise etiology unclear. Tanya Nichols has mild to moderate microvascular changes on imaging. CTA head was negative. Carotid US negative. TTE showed moderate asymmetric left ventricular hypertrophy which points to longer-standing uncontrolled hypertension as an etiology for stroke. Additional workup is needed: - CTA neck (ordered) - Labs pending: Cardiolipin antibodies, ANA, UDS, HgbA1c - LDL is 91, goal <70; Continue lipitor 40mg  daily - DAPT: 81mg  ASA + Plavix 75 mg daily into 3 weeks followed by aspirin alone.  40mg  lipitor daily - Permissive hypertension  #DVT ppx Patient is ambulatory, no SCDs - On lovenox 40mg  daily  #HTN Tanya Nichols does not have a history of HTN but BPs have been 160s-190s/80s-100s  in the hospital. - Trend BPs - Continue Cozaar 50 mg daily - Permissive  hypertension OK (< 220/120) but gradually normalize in 5-7 days)  #Dispo Pending above labs and imaging - PT/OT suggests outpatient therapy follow-up - SLP reassessment appreciated - Home when medically appropriate.  Hospital day # 5      She has persistent significant dysphagia and has a core track tube for nutrition.  She is participating in the sleep smart study for stroke prevention .  She did participate in the sleep smart study and and did try the CPAP mask overnight but due to the core track tube there was no right sitting and there was an air leak making steady nondiagnostic.  CPAP mask trial will be repeated once core track tube is removed..  Recommend continue ongoing speech therapy.   Continue tube feeds for now..  Discussed with patient and speech therapist if her dysphagia does not improve over the next couple of days schedule PEG tube on Friday.  Contacted trauma team for tentative PEG placement for Friday unless swallowing improves.  Continue aspirin and stop Plavix for the PEG tube.  Discussed with Dr. Mikey Bussing.  Greater than 50% time during this 25-minute visit was spent in counseling and coordination of care about stroke and discussion with care team and answering questions.  Delia Heady, MD Medical Director [facility] Stroke Center Pager: [phone] 04/01/2021 4:00 PM

## 2021-04-01 NOTE — Progress Notes (Signed)
IR consulted by Dr. Oswaldo Done for possible image-guided percutaneous gastrostomy tube placement.  Case/images have been reviewed by Dr. Lowella Dandy who approves procedure.  Patient currently on Plavix 75 mg once daily- this requires a 5 day hold prior to placement per IR protocol. In addition, primary team/patient not sure if gastrostomy tube indicated at this time. Discussed case with Dr. Austin Miles (IMTS)- at this time requests hold on gastrostomy tube placement given above. No plans for IR interventions at this time- will delete order. Dr. Austin Miles aware to re-consult IR if procedure desired in future.  IR available as needed.   Tanya Boga Eliza Green, PA-C 04/01/2021, 10:44 AM

## 2021-04-01 NOTE — Consult Note (Signed)
[facility] Consult [facility]  Tanya Nichols 03/28/1971  [id number].    Requesting MD: Oswaldo DoneVincent, MD Chief Complaint/Reason for Consult: PEG placement, dysphagia   HPI:  [name] is a 50 y/o obese female with no known PMH, no PCP, who [facility] with cc facial droop, dysarthria, nausea, vomiting, and RUE tingling that started 6/15. 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 Neurology was consulted and she was admitted to the internal medicine service for further management.   Patient has been receiving tube feeds via cortrak for the last 4 days which she is tolerated. MBS 6/20 was abnormal. SLP following patient for ongoing dysphagia and continues to recommend NPO due to aspiration risk.   Trauma has been consulted for consideration of PEG placement. Patient reports a prior surgical history of appendectomy when she was a child without complication. She is currently on 81 mg ASA after her CVA, plavix was held (last dose 6/21) for consideration of feeding tube. no other anticoagulation. She denies tobacco, alcohol, or drug use.   ROS: Review of Systems  Constitutional: Negative.   HENT: Negative.    Eyes: Negative.   Respiratory: Negative.    Cardiovascular: Negative.   Gastrointestinal: Negative.   Genitourinary: Negative.   Musculoskeletal: Negative.   Skin: Negative.   Neurological:  Positive for tingling, sensory change, speech change and weakness.  Endo/Heme/Allergies: Negative.   Psychiatric/Behavioral: Negative.     History reviewed. No pertinent family history.  History reviewed. No pertinent past medical history.  History reviewed. No pertinent surgical history.  Social History:  reports that she has never smoked. She has never used smokeless tobacco. She reports previous alcohol use. She reports that she does not use drugs.  Allergies: Not on File  No medications prior to admission.    Blood pressure (!) 169/94,  pulse 77, temperature 98.3 F (36.8 C), temperature source Oral, resp. rate 18, height 5\' 4"  (1.626 m), weight 89.9 kg, SpO2 98 %. Physical Exam: Constitutional: NAD; conversant; no deformities; walking in her room during my exam Eyes: Moist conjunctiva; no lid lag; anicteric; PERRL Neck: Trachea midline; no thyromegaly Lungs: Normal respiratory effort; no tactile fremitus CV: RRR; no palpable thrills; no pitting edema GI: Abd soft, obese, non-tender, RLQ surgical scar from previous appendectomy appears well-healing, mild ecchymosis lower abd wall ; no palpable hepatosplenomegaly MSK: Normal gait; no clubbing/cyanosis Psychiatric: Appropriate affect; alert and oriented x3 Lymphatic: No palpable cervical or axillary lymphadenopathy  Results for orders placed or performed during the hospital encounter of 03/26/21 (from the past 48 hour(s))  Basic metabolic panel     Status: Abnormal   Collection Time: 03/30/21  9:49 AM  Result Value Ref Range   Sodium 141 135 - 145 mmol/L   Potassium 4.1 3.5 - 5.1 mmol/L   Chloride 109 98 - 111 mmol/L   CO2 26 22 - 32 mmol/L   Glucose, Bld 148 (H) 70 - 99 mg/dL    Comment: Glucose reference range applies only to samples taken after fasting for at least 8 hours.   BUN 17 6 - 20 mg/dL   Creatinine, Ser 8.180.86 0.44 - 1.00 mg/dL   Calcium 9.4 8.9 - 56.310.3 mg/dL   GFR, Estimated >14>60 >97>60 mL/min    Comment: (NOTE) Calculated using the CKD-EPI Creatinine Equation (2021)    Anion gap 6 5 - 15    Comment: Performed at [facility], [address] [place], [place] [address]  Magnesium  Status: None   Collection Time: 03/30/21  9:49 AM  Result Value Ref Range   Magnesium 2.2 1.7 - 2.4 mg/dL    Comment: Performed at [facility], [address], [place], [place] [address]  Glucose, capillary     Status: Abnormal   Collection Time: 03/30/21  4:01 PM  Result Value Ref Range   Glucose-Capillary 103 (H) 70 - 99 mg/dL    Comment: Glucose reference  range applies only to samples taken after fasting for at least 8 hours.   Comment 1 Notify RN   Glucose, capillary     Status: Abnormal   Collection Time: 03/30/21 11:53 PM  Result Value Ref Range   Glucose-Capillary 126 (H) 70 - 99 mg/dL    Comment: Glucose reference range applies only to samples taken after fasting for at least 8 hours.  Glucose, capillary     Status: Abnormal   Collection Time: 03/31/21  8:09 AM  Result Value Ref Range   Glucose-Capillary 116 (H) 70 - 99 mg/dL    Comment: Glucose reference range applies only to samples taken after fasting for at least 8 hours.  Glucose, capillary     Status: None   Collection Time: 03/31/21  4:12 PM  Result Value Ref Range   Glucose-Capillary 88 70 - 99 mg/dL    Comment: Glucose reference range applies only to samples taken after fasting for at least 8 hours.  Glucose, capillary     Status: Abnormal   Collection Time: 04/01/21 12:33 AM  Result Value Ref Range   Glucose-Capillary 141 (H) 70 - 99 mg/dL    Comment: Glucose reference range applies only to samples taken after fasting for at least 8 hours.  Glucose, capillary     Status: Abnormal   Collection Time: 04/01/21  8:05 AM  Result Value Ref Range   Glucose-Capillary 110 (H) 70 - 99 mg/dL    Comment: Glucose reference range applies only to samples taken after fasting for at least 8 hours.   CT ABDOMEN WO CONTRAST  Result Date: 03/31/2021 CLINICAL DATA:  Unintended weight loss, nasogastric tube placement, history of stroke EXAM: CT ABDOMEN WITHOUT CONTRAST TECHNIQUE: Multidetector CT imaging of the abdomen was performed following the standard protocol without IV contrast. Sagittal and coronal MPR images reconstructed from axial data set. COMPARISON:  None FINDINGS: Lower chest: Lung bases clear Hepatobiliary: Gallbladder and liver normal appearance Pancreas: Normal appearance Spleen: Normal appearance Adrenals/Urinary Tract: Adrenal glands, kidneys, and visualized ureters normal  appearance Stomach/Bowel: Tip of nasogastric tube projects over second portion of duodenum. Stomach and visualized bowel loops unremarkable. Vascular/Lymphatic: Aorta normal caliber.  No adenopathy. Other: No free air or free fluid. No hernia. Surgical clips RIGHT lower quadrant. 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 Tip of nasogastric tube projects over second portion of duodenum. No acute intra-abdominal abnormalities. Electronically Signed   By: Ulyses Southward M.D.   On: 03/31/2021 15:33   DG Abd Portable 1V  Result Date: 03/30/2021 CLINICAL DATA:  Feeding tube placement EXAM: PORTABLE ABDOMEN - 1 VIEW COMPARISON:  Radiograph 03/28/2021 FINDINGS: Feeding tube tip overlies the distal stomach. Nonobstructive bowel gas pattern. Small metallic clips overlie the right lower abdomen. IMPRESSION: Feeding tube tip overlies the distal stomach. Electronically Signed   By: Caprice Renshaw   On: 03/30/2021 15:10   DG Swallowing Func-Speech Pathology  Result Date: 03/30/2021 Formatting of this result is different from  the original. Objective Swallowing Evaluation: Type of Study: MBS-Modified Barium Swallow Study  Patient Details Name: Celia Friedland MRN: [id number] Date of Birth: Jul 17, 1971 Today's Date: 03/30/2021 Time: SLP Start Time (ACUTE ONLY): 1310 -SLP Stop Time (ACUTE ONLY): 1340 SLP Time Calculation (min) (ACUTE ONLY): 30 min Past Medical History: No past medical history on file. Past Surgical History: HPI: 51 yo F w/ no known PMH presenting to [facility] with dysarthria, dysphagia, facial droop, nausea, vomiting. She was in her usual state of health until 03/25/21 at 10am when she developed acute onset vertigo with nausea and vomiting. MRI showed acute infarct at left pontomedullary junction. Pt failed [employer] screen.  Subjective: alert Assessment / Plan / Recommendation CHL IP  CLINICAL IMPRESSIONS 03/30/2021 Clinical Impression Pt presents with severe pharyngeal dysphagia.  Oral function WNL.  There is good base-of-tongue propulsion.  There is noted anterior laryngeal movement, but minimal laryngeal elevation and no pharyngeal contraction. The epiglottis does not invert over the larynx.  The pharyngeal swallow response is not elicited. Thin liquids were immediately aspirated, eliciting a strong cough response.  Nectar thick liquids and applesauce were expectorated before they could enter the airway. Pt's laryngeal-pharyngeal mechanism is not functional at this time.  Sensation appears to have improved.  Recommend proceeding with cortrak for nutrition. SLP Visit Diagnosis Dysphagia, pharyngeal phase (R13.13) Attention and concentration deficit following -- Frontal lobe and executive function deficit following -- Impact on safety and function Severe aspiration risk   CHL IP TREATMENT RECOMMENDATION 03/30/2021 Treatment Recommendations Therapy as outlined in treatment plan below   Prognosis 03/30/2021 Prognosis for Safe Diet Advancement Good Barriers to Reach Goals -- Barriers/Prognosis Comment -- CHL IP DIET RECOMMENDATION 03/30/2021 SLP Diet Recommendations NPO;Ice chips PRN after oral care Liquid Administration via -- Medication Administration -- Compensations -- Postural Changes --   CHL IP OTHER RECOMMENDATIONS 03/30/2021 Recommended Consults -- Oral Care Recommendations Oral care QID;Patient independent with oral care Other Recommendations --   CHL IP FOLLOW UP RECOMMENDATIONS 03/30/2021 Follow up Recommendations Outpatient SLP   CHL IP FREQUENCY AND DURATION 03/30/2021 Speech Therapy Frequency (ACUTE ONLY) min 3x week Treatment Duration 1 week      CHL IP ORAL PHASE 03/30/2021 Oral Phase WFL Oral - Pudding Teaspoon -- Oral - Pudding Cup -- Oral - Honey Teaspoon -- Oral - Honey Cup -- Oral - Nectar Teaspoon -- Oral - Nectar Cup -- Oral - Nectar Straw -- Oral - Thin Teaspoon -- Oral - Thin Cup  -- Oral - Thin Straw -- Oral - Puree -- Oral - Mech Soft -- Oral - Regular -- Oral - Multi-Consistency -- Oral - Pill -- Oral Phase - Comment --  CHL IP PHARYNGEAL PHASE 03/30/2021 Pharyngeal Phase Impaired Pharyngeal- Pudding Teaspoon -- Pharyngeal -- Pharyngeal- Pudding Cup -- Pharyngeal -- Pharyngeal- Honey Teaspoon -- Pharyngeal -- Pharyngeal- Honey Cup -- Pharyngeal -- Pharyngeal- Nectar Teaspoon Reduced pharyngeal peristalsis;Reduced epiglottic inversion;Reduced laryngeal elevation;Reduced airway/laryngeal closure;Pharyngeal residue - valleculae;Pharyngeal residue - pyriform Pharyngeal -- Pharyngeal- Nectar Cup -- Pharyngeal -- Pharyngeal- Nectar Straw -- Pharyngeal -- Pharyngeal- Thin Teaspoon Reduced pharyngeal peristalsis;Reduced epiglottic inversion;Reduced laryngeal elevation;Reduced airway/laryngeal closure;Penetration/Aspiration before swallow;Trace aspiration;Pharyngeal residue - pyriform;Pharyngeal residue - valleculae Pharyngeal Material enters airway, passes BELOW cords and not ejected out despite cough attempt by patient Pharyngeal- Thin Cup -- Pharyngeal -- Pharyngeal- Thin Straw -- Pharyngeal -- Pharyngeal- Puree Reduced pharyngeal peristalsis;Reduced epiglottic inversion;Reduced airway/laryngeal closure;Reduced laryngeal elevation;Pharyngeal residue - valleculae;Pharyngeal residue - pyriform Pharyngeal -- Pharyngeal- Mechanical Soft -- Pharyngeal -- Pharyngeal- Regular -- Pharyngeal --  Pharyngeal- Multi-consistency -- Pharyngeal -- Pharyngeal- Pill -- Pharyngeal -- Pharyngeal Comment --  No flowsheet data found. 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  - continue to hold plavix   Adam Phenix, [facility] Please see Amion for pager number during day hours 7:00am-4:30pm 04/01/2021, 9:22 AM

## 2021-04-01 NOTE — Progress Notes (Signed)
   Subjective:   Coughing with liquids. No big swallowing has occurred. Finds applesauce easier to swallow, takes a bit longer for coughing to occur. Patient is doing swallowing exercises hourly. Unable to wear CPAP overnight.   Discussed with patient we will wait until Friday to determine peg tube placement.    Objective:  Vital signs in last 24 hours: Vitals:   03/31/21 1310 03/31/21 1600 04/01/21 0025 04/01/21 0500  BP: (!) 168/94 (!) 133/92 140/76 (!) 164/90  Pulse: 99 92 84 80  Resp: 18 18 19 19   Temp: 98.7 F (37.1 C) 98.5 F (36.9 C) 98.4 F (36.9 C) 98.3 F (36.8 C)  TempSrc: Oral Oral Oral Oral  SpO2: 99% 100% 98% 97%  Weight:      Height:       Physical Exam: General: Obese, middle-aged female, sitting in bed during encounter, NAD. Pulm: Normal work of breathing, no respiratory distress noted. Neuro: AAOx4, strength 5 out of 5 bilaterally.  Improved sensation in right upper extremity.  Right lower extremity exam unchanged from yesterday. Psych: Calm and cooperative.  Assessment/Plan:  Active Problems:   CVA (cerebral vascular accident) (HCC)   Acute CVA (cerebrovascular accident) (HCC)  Small acute L pontomedullary CVA Continues to have significant dysphagia, tolerating continuous tube feeds via cortrak.  Trauma consulted for possible PEG tube placement on Friday.  Plavix being held as of today in anticipation for PEG tube placement. -Appreciate trauma assistance -Holding Plavix -Continue aspirin and Lipitor for secondary prevention -PT/OT/SLP recommending outpatient PT and SLP  HTN Blood pressures moderately elevated this morning.  On Cozaar 100 mg daily per tube.  May need to add HCTZ at discharge. -Continue Cozaar 100 mg daily  Prior to Admission Living Arrangement: Home Anticipated Discharge Location: TBD Barriers to Discharge: continued medical management Dispo: Anticipated discharge in approximately 2-3  day(s).   Saturday, MD Pager:  [phone] 04/01/2021, 6:09 AM

## 2021-04-01 NOTE — Progress Notes (Signed)
Occupational Therapy Treatment Patient Details Name: Tanya Nichols MRN: [id number] DOB: December 26, 1970 Today's Date: 04/01/2021    History of present illness 50 y.o. female presents to [facility] ED on 03/26/2021 with reports of nausea, vomiting, RUE numbness/tingling, dysphagia, and dysarthria. MRI demonstrates acute pontomedullary junction infarct. Pt reports insignificant PMH.   OT comments  Pt at this time was able to report compensation for self regulation  due to decease to light touch and tempeture at this time. Pt was educated about skin check as they do have dogs in the home for any wounds. Pt was able to tolerate 20 mins of activity but noted one episode of RLE slight postural change. Pt was educated about proper footwear and obstacle negotiation in session.     Follow Up Recommendations   (OP PT for vestibular)    Equipment Recommendations       Recommendations for Other Services      Precautions / Restrictions Precautions Precautions: None Precaution Comments: decrease in sensation on R side Restrictions Weight Bearing Restrictions: No       Mobility Bed Mobility Overal bed mobility: Independent                  Transfers Overall transfer level: Independent Equipment used: None                  Balance Overall balance assessment: Needs assistance Sitting-balance support: Feet supported;No upper extremity supported Sitting balance-Leahy Scale: Good                         High Level Balance Comments: Pt requires cues on obsticles in pathway           ADL either performed or assessed with clinical judgement   ADL Overall ADL's : Needs assistance/impaired Eating/Feeding: Independent;Sitting   Grooming: Wash/dry hands;Wash/dry face;Modified independent;Cueing for safety;Cueing for sequencing;Standing   Upper Body Bathing: Modified independent;Standing   Lower Body Bathing: Modified independent;Sit to/from stand   Upper Body Dressing :  Modified independent;Sitting   Lower Body Dressing: Modified independent;Sit to/from stand   Toilet Transfer: Modified Tour manager and Hygiene: Modified independent;Sit to/from stand   Tub/ Shower Transfer: Modified independent;Cueing for safety;Cueing for sequencing   Functional mobility during ADLs: Supervision/safety;Cueing for safety;Cueing for sequencing General ADL Comments: Pt is becming more aware of decrease in balance     Vision       Perception     Praxis      Cognition Arousal/Alertness: Awake/alert Behavior During Therapy: [facility] for tasks assessed/performed                                            Exercises     Shoulder Instructions       General Comments      Pertinent Vitals/ Pain       Pain Assessment: No/denies pain  Home Living                                          Prior Functioning/Environment              Frequency  Min 2X/week        Progress Toward Goals  OT Goals(current goals can now be found in  the care plan section)  Progress towards OT goals: Progressing toward goals  Acute Rehab OT Goals Patient Stated Goal: to be able to cook and drive OT Goal Formulation: With patient Time For Goal Achievement: 04/11/21 Potential to Achieve Goals: Good ADL Goals Additional ADL Goal #1: Pt will voice 2 compensations due to decrease in self regulation to tempatures and light touch Additional ADL Goal #2: Pt will tolerate 20 mins of activity with no signs of LOB with acitivity  Plan Discharge plan remains appropriate    Co-evaluation                 AM-PAC OT "6 Clicks" Daily Activity     Outcome Measure   Help from another person eating meals?: None Help from another person taking care of personal grooming?: None Help from another person toileting, which includes using toliet, bedpan, or urinal?: None Help from another person bathing  (including washing, rinsing, drying)?: None Help from another person to put on and taking off regular upper body clothing?: None Help from another person to put on and taking off regular lower body clothing?: None 6 Click Score: 24    End of Session Equipment Utilized During Treatment: Gait belt  OT Visit Diagnosis: Unsteadiness on feet (R26.81)   Activity Tolerance Patient tolerated treatment well   Patient Left in bed;with call bell/phone within reach   Nurse Communication Other (comment) (skin precautions)        Time: 2876-8115 OT Time Calculation (min): 26 min  Charges: OT General Charges $OT Visit: 1 Visit OT Treatments $Self Care/Home Management : 23-37 mins  Alphia Moh OTR/L  Acute Rehab Services  [phone] office number [phone] pager number    Alphia Moh 04/01/2021, 9:30 AM

## 2021-04-02 ENCOUNTER — Inpatient Hospital Stay (HOSPITAL_COMMUNITY): Payer: BLUE CROSS/BLUE SHIELD

## 2021-04-02 LAB — GLUCOSE, CAPILLARY
Glucose-Capillary: 115 mg/dL — ABNORMAL HIGH (ref 70–99)
Glucose-Capillary: 127 mg/dL — ABNORMAL HIGH (ref 70–99)
Glucose-Capillary: 136 mg/dL — ABNORMAL HIGH (ref 70–99)

## 2021-04-02 MED ORDER — AMLODIPINE BESYLATE 5 MG PO TABS
5.0000 mg | ORAL_TABLET | Freq: Every day | ORAL | Status: DC
  Administered 2021-04-02: 5 mg
  Filled 2021-04-02: qty 1

## 2021-04-02 MED ORDER — AMLODIPINE BESYLATE 5 MG PO TABS: 5.0000 mg | ORAL_TABLET | Freq: Every day | ORAL | Status: DC

## 2021-04-02 NOTE — Progress Notes (Addendum)
   Subjective:   Doing fine today.  She had her barium swallow done earlier today, appears that her swallowing function has improved somewhat, but she is still having a lot of difficulty.  States that it looks like she will need a PEG tube.  Discussed addition of an additional antihypertensive to help bring her blood pressure down.  She agrees.  Objective:  Vital signs in last 24 hours: Vitals:   04/01/21 2014 04/01/21 2358 04/02/21 0503 04/02/21 0734  BP: (!) 168/93 (!) 149/85 (!) 155/86 (!) 173/94  Pulse: 88 83 81 85  Resp: 18 18 18 18   Temp: 98.9 F (37.2 C) 98.5 F (36.9 C) 98.7 F (37.1 C) 98.3 F (36.8 C)  TempSrc: Oral Oral Oral Oral  SpO2: 100% 97% 97% 98%  Weight:   91.1 kg   Height:       Physical Exam: General: Obese, middle-aged female, standing up and walking during encounter, NAD. HENT: cortrak in place.  Pulm: Normal work of breathing, no respiratory distress noted. Neuro: AAOx4, no dysarthria or facial drooping noted. Psych: Calm and cooperative.  Assessment/Plan:  Active Problems:   CVA (cerebral vascular accident) (HCC)   Acute CVA (cerebrovascular accident) (HCC)  Small acute L pontomedullary CVA Patient has persistence of her dysphagia.  Modified barium swallow showing improvement in swallow function however continues to have significant esophageal dysphagia with episodes of regurgitation.  Patient will likely need PEG tube placement tomorrow for long-term nutrition.  Trauma has been consulted for this.  Per SLP, patient would benefit from a repeat MBS as an outpatient in approximately 3 weeks. -Appreciate neurology assistance -Appreciate trauma assistance -NPO at midnight in anticipation for PEG tube placement -Holding Plavix (day 2) -Continue aspirin and Lipitor for secondary prevention -PT/SLP recommending outpatient PT and SLP -We will perform trial of CPAP for suspected OSA once cortrak is removed  HTN Blood pressures remain moderately  elevated today.  We will add another antihypertensive to help normalize BP. -Continue Cozaar 100 mg daily per tube -Added Norvasc 5 mg daily per tube  Prior to Admission Living Arrangement: Home Anticipated Discharge Location: Home Barriers to Discharge: Continued medical management Dispo: Anticipated discharge in approximately 2-3  day(s).   , MD Pager: [phone] 04/02/2021, 7:45 AM

## 2021-04-02 NOTE — TOC CAGE-AID Note (Signed)
Transition of Care [facility]) - CAGE-AID Screening   Patient Details  Name: Tanya Nichols MRN: [id number] Date of Birth: Sep 26, 1971  Transition of Care [facility]) CM/SW Contact:    Katha Hamming, RN Phone Number: [phone] 04/02/2021, 6:05 PM   Clinical Narrative:  Patient denies substance abuse, states she only drinks on holidays.  CAGE-AID Screening:    Have You Ever Felt You Ought to Cut Down on Your Drinking or Drug Use?: No Have People Annoyed You By Critizing Your Drinking Or Drug Use?: No Have You Felt Bad Or Guilty About Your Drinking Or Drug Use?: No Have You Ever Had a Drink or Used Drugs First Thing In The Morning to Steady Your Nerves or to Get Rid of a Hangover?: No CAGE-AID Score: 0  Substance Abuse Education Offered: No

## 2021-04-02 NOTE — Progress Notes (Signed)
Modified Barium Swallow Progress Note  Patient Details  Name: Shayann Garbutt MRN: [id number] Date of Birth: 12-09-1970  Today's Date: 04/02/2021  Modified Barium Swallow completed.  Full report located under Chart Review in the Imaging Section.  Brief recommendations include the following:  Clinical Impression  Pt demonstrates improvement in swallow function today. When taking a large sip of thin liquids, pt is now able to achieve intermittent hyoid excursion and UES opening for 20- 80% passage of bolus through UES. At times 100% of bolus passed. Visual biofeedback provided to pt with effective result. Pt had no anxiety during testing and was willing to follow all instructions. Despite maximal effort and liquid wash added to puree boluses, pt was never able to open UES for purees and needed to use pharyngeal squeeze to pass bolus back to mouth and spit out. Esophageal sweep also revealed moderate distal esopahgeal residue with liquids and slow/partial LES relaxation.   Strongly suspect sensory impairment/impairment to efferent nervous system pathway for appropriate UES and LES function, particularly given pts report of decreased sensation around her neck. She says it feels like it 'goes cold' inside her throat. Hopeful that with ongoing swallowing opportunities, pt will have improvement in neural pathway. However, given her struggle with solids and potential for esophageal dysphagia with episodes of regurgitation pt is still recommended to have a PEG tube placed so she can recover with adequate nutrition. She can start a full liquid diet as well and attempt solids as she feels comfortable. She may benefit from having a f/u MBS scheduled for approximately 3 weeks from now to determine further progress. She may need interventions for esophageal dysphagia in the future.   Swallow Evaluation Recommendations   Recommended Consults: Consider esophageal assessment;Consider GI evaluation   SLP Diet  Recommendations: Thin liquid   Liquid Administration via: Cup;Straw   Medication Administration: Via alternative means       Compensations: Slow rate (large sips, but slow rate of intake)       Oral Care Recommendations: Oral care BID;Patient independent with oral care       Harlon Ditty, MA CCC-SLP  Acute Rehabilitation Services Office [phone]  Claudine Mouton 04/02/2021,9:54 AM

## 2021-04-02 NOTE — Progress Notes (Signed)
Consent for PEG tube placement reviewed and signed. Place in pt. Chart.

## 2021-04-02 NOTE — Care Management (Addendum)
Discussed with DR Austin Miles and Kendal Hymen with speech. Insurance will not cover home health RN and OP speech at the same time. Even with [facility] patient will need to be taught tube feedings prior to discharge. They both feel patient would benefit more with OP SP.   [facility] is not in network with patient's insurance.   Zach with [facility] checking to see if they are in network with patient's insurance.   NCM called Noreene Larsson Nurse Case Manager at [facility] to see which eternal and OP centers are in network and left message   Patient's insurance is in network with [facility] location [address] [place] [address] . NCM called same spoke to [place] and confirmed. Discussed with patient she is in agreement to go to that location for OP PT and SP. Referral faxed   NCM secure chatted MD to sign prescription. Once signed NCM will fax.   Patient has an appointment with a MD to establish care, however she is unsure of the name of the MD . OP PT and SP will need a MD who knows the patient to follow until seen by new MD to sign ongoing orders will ask attending.  Oletha Cruel with [facility] is in network with patient's insurance and can provide tube feedings for home. He will call patient on her hospital phone to discuss. Ian Malkin is requesting hospital send patient home with three days of tube feeds and supplies. NCM called main pharmacy and spoke to Windell Moulding , she will check to see if they can provide three days of tube feeds at home.   Windell Moulding in pharmacy returned call. Pharmacy will bill [facility] three days worth of tube feeding. Someone from [employer] will need to go to main pharmacy tomorrow and pick the tube feeding up and take to patient. Zach with [employer] aware. Zach requesting nursing give the patient three days worth of supplies at discharge.  Per [employer] patient's insurance covers supplies but does not cover the tube feeding. Zach discussed with patient. Patient asked if she could  price tube feeding with another company. Ian Malkin provided her with another agency's information. NCM asked nurse and AD about supplies

## 2021-04-02 NOTE — Progress Notes (Signed)
Initial Nutrition Assessment  DOCUMENTATION CODES:   Obesity unspecified  INTERVENTION:   Continue tube feeding via Cortrak with Jevity 1.2 at 65 ml/h (1560 ml per day) to provide 1872 kcal, 87 gm protein, 1279 ml free water daily  After PEG is placed, recommend change to bolus feedings: Jevity 1.2 1.5 containers TID and 2 containers once daily for a total of 6.5 containers per day. Bolus TF will provide: 1853 kcal, 86 gm protein, 1242 ml free water daily. Free water flushes 75 ml before and 75 ml after each bolus feeding for an additional 600 ml free water per day.  NUTRITION DIAGNOSIS:   Inadequate oral intake related to dysphagia as evidenced by NPO status.  Ongoing  GOAL:   Patient will meet greater than or equal to 90% of their needs  Met with TF  MONITOR:   TF tolerance, Labs, Diet advancement, PO intake  REASON FOR ASSESSMENT:   Consult Enteral/tube feeding initiation and management  ASSESSMENT:   50 yo female admitted with acute CVA. PMH includes obesity.  Currently receiving Jevity 1.2 at 65 ml/h via Cortrak (1872 kcal, 87 gm protein, 1279 ml free water daily).  Diet advanced to full liquids today S/P MBS with SLP. Patient will be unable to meet nutrition needs PO d/t ongoing severe dysphagia. She was unable to swallow any pureed foods. Plans for PEG tomorrow by Surgery.   Patient states that she has been looking up how to care for her PEG. She does not want to do continuous feedings; she prefers bolus feedings. Discussed goal bolus TF regimen with patient.    Labs reviewed.  CBG: [phone]  Medications reviewed and include Protonix.  Diet Order:   Diet Order             Diet NPO time specified  Diet effective midnight           Diet full liquid Room service appropriate? Yes; Fluid consistency: Thin  Diet effective now                   EDUCATION NEEDS:   Education needs have been addressed  Skin:  Skin Assessment: Reviewed RN  Assessment  Last BM:  6/20  Height:   Ht Readings from Last 1 Encounters:  03/26/21 5' 4"  (1.626 m)    Weight:   Wt Readings from Last 1 Encounters:  04/02/21 91.1 kg    Ideal Body Weight:  54.5 kg  BMI:  Body mass index is 34.48 kg/m.  Estimated Nutritional Needs:   Kcal:  1700-1900  Protein:  85-100 gm  Fluid:  >/= 1.8 L    Lucas Mallow, RD, LDN, CNSC Please refer to Amion for contact information.

## 2021-04-02 NOTE — Progress Notes (Signed)
Patient ID: Tanya Nichols, female   DOB: September 08, 1971, 50 y.o.   MRN: [id number] Noted barium swallow results and rec for PEG. I discussed the procedure, risks, and benefits with her and she agrees. Scheduled for tomorrow.  Violeta Gelinas, MD, MPH, FACS Please use [email or web address] to contact on call provider

## 2021-04-03 ENCOUNTER — Encounter (HOSPITAL_COMMUNITY)
Admission: EM | Disposition: A | Discharge status: Home / Self Care | Payer: Self-pay | Source: Home / Self Care | Attending: Student in an Organized Health Care Education/Training Program

## 2021-04-03 ENCOUNTER — Inpatient Hospital Stay (HOSPITAL_COMMUNITY): Payer: BLUE CROSS/BLUE SHIELD | Admitting: Anesthesiology

## 2021-04-03 ENCOUNTER — Encounter (HOSPITAL_COMMUNITY): Payer: Self-pay | Admitting: Student in an Organized Health Care Education/Training Program

## 2021-04-03 ENCOUNTER — Other Ambulatory Visit (HOSPITAL_COMMUNITY): Payer: Self-pay

## 2021-04-03 DIAGNOSIS — R1310 Dysphagia, unspecified: Secondary | ICD-10-CM

## 2021-04-03 HISTORY — PX: ESOPHAGOGASTRODUODENOSCOPY (EGD) WITH PROPOFOL: SHX5813

## 2021-04-03 HISTORY — PX: PEG PLACEMENT: SHX5437

## 2021-04-03 LAB — GLUCOSE, CAPILLARY
Glucose-Capillary: 100 mg/dL — ABNORMAL HIGH (ref 70–99)
Glucose-Capillary: 110 mg/dL — ABNORMAL HIGH (ref 70–99)
Glucose-Capillary: 160 mg/dL — ABNORMAL HIGH (ref 70–99)
Glucose-Capillary: 56 mg/dL — ABNORMAL LOW (ref 70–99)
Glucose-Capillary: 91 mg/dL (ref 70–99)

## 2021-04-03 SURGERY — ESOPHAGOGASTRODUODENOSCOPY (EGD) WITH PROPOFOL
Anesthesia: Monitor Anesthesia Care

## 2021-04-03 MED ORDER — PROPOFOL 10 MG/ML IV BOLUS
INTRAVENOUS | Status: DC | PRN
  Administered 2021-04-03 (×3): 50 mg via INTRAVENOUS

## 2021-04-03 MED ORDER — DEXTROSE 50 % IV SOLN
INTRAVENOUS | Status: AC
  Filled 2021-04-03: qty 50

## 2021-04-03 MED ORDER — AMLODIPINE BESYLATE 10 MG PO TABS
10.0000 mg | ORAL_TABLET | Freq: Every day | ORAL | Status: DC
  Administered 2021-04-04 – 2021-04-05 (×2): 10 mg
  Filled 2021-04-03 (×2): qty 1

## 2021-04-03 MED ORDER — PROPOFOL 500 MG/50ML IV EMUL
INTRAVENOUS | Status: DC | PRN
  Administered 2021-04-03: 75 ug/kg/min via INTRAVENOUS

## 2021-04-03 MED ORDER — LIDOCAINE-EPINEPHRINE (PF) 1 %-1:200000 IJ SOLN
10.0000 mL | Freq: Once | INTRAMUSCULAR | Status: DC
  Filled 2021-04-03: qty 10

## 2021-04-03 MED ORDER — LACTATED RINGERS IV SOLN: INTRAVENOUS | Status: DC | PRN

## 2021-04-03 MED ORDER — LIDOCAINE 2% (20 MG/ML) 5 ML SYRINGE
INTRAMUSCULAR | Status: DC | PRN
  Administered 2021-04-03: 100 mg via INTRAVENOUS

## 2021-04-03 NOTE — Anesthesia Preprocedure Evaluation (Addendum)
Anesthesia Evaluation  Patient identified by MRN, date of birth, ID band Patient awake    Reviewed: Allergy & Precautions, NPO status , Patient's Chart, lab work & pertinent test results  Airway Mallampati: II  TM Distance: >3 FB Neck ROM: Full    Dental no notable dental hx.    Pulmonary neg pulmonary ROS,    Pulmonary exam normal breath sounds clear to auscultation       Cardiovascular Exercise Tolerance: Good hypertension, Normal cardiovascular exam Rhythm:Regular Rate:Normal     Neuro/Psych CVA (numbness over right side; dysphagia), Residual Symptoms negative psych ROS   GI/Hepatic negative GI ROS, Neg liver ROS,   Endo/Other  obesity  Renal/GU negative Renal ROS  negative genitourinary   Musculoskeletal negative musculoskeletal ROS (+)   Abdominal   Peds negative pediatric ROS (+)  Hematology negative hematology ROS (+)   Anesthesia Other Findings [place]   Reproductive/Obstetrics negative OB ROS                            Anesthesia Physical Anesthesia Plan  ASA: 3  Anesthesia Plan: MAC   Post-op Pain Management:    Induction: Intravenous  PONV Risk Score and Plan: 2 and Propofol infusion, Treatment may vary due to age or medical condition and TIVA  Airway Management Planned: Natural Airway  Additional Equipment: None  Intra-op Plan:   Post-operative Plan:   Informed Consent: I have reviewed the patients History and Physical, chart, labs and discussed the procedure including the risks, benefits and alternatives for the proposed anesthesia with the patient or authorized representative who has indicated his/her understanding and acceptance.       Plan Discussed with: CRNA and Anesthesiologist  Anesthesia Plan Comments:        Anesthesia Quick Evaluation

## 2021-04-03 NOTE — Progress Notes (Addendum)
Patient TF stopped since midnight for PEG placement today. Consent signed and in chart. PRe-op check list completed. Patient is aware of POC and aware to remove all jeweleries.

## 2021-04-03 NOTE — Progress Notes (Signed)
Patient ID: Tanya Nichols, female   DOB: [birth year], 50 y.o.   MRN: [id number]     Subjective: No new complaints. ROS negative except as listed above. Objective: Vital signs in last 24 hours: Temp:  [97.9 F (36.6 C)-98.7 F (37.1 C)] 98.1 F (36.7 C) (06/24 0805) Pulse Rate:  [79-88] 79 (06/24 0805) Resp:  [18] 18 (06/24 0805) BP: (146-159)/(85-97) 146/97 (06/24 0805) SpO2:  [98 %-100 %] 100 % (06/24 0805) Weight:  [90.6 kg] 90.6 kg (06/24 0441) Last BM Date: 03/30/21  Intake/Output from previous day: 06/23 0701 - 06/24 0700 In: 80 [NG/GT:80] Out: -  Intake/Output this shift: No intake/output data recorded.  General appearance: cooperative Resp: clear to auscultation bilaterally Cardio: regular rate and rhythm GI: soft, NT  Lab Results: CBC  No results for input(s): WBC, HGB, HCT, PLT in the last 72 hours. BMET No results for input(s): NA, K, CL, CO2, GLUCOSE, BUN, CREATININE, CALCIUM in the last 72 hours. PT/INR No results for input(s): LABPROT, INR in the last 72 hours. ABG No results for input(s): PHART, HCO3 in the last 72 hours.  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 Procedure, risks, and benefits discussed and she agrees.  LOS: 7 days    Violeta Gelinas, MD, MPH, FACS Trauma & General Surgery Use [email or web address] to contact on call provider  04/03/2021

## 2021-04-03 NOTE — Transfer of Care (Signed)
Immediate Anesthesia Transfer of Care Note  Patient: Tanya Nichols  Procedure(s) Performed: ESOPHAGOGASTRODUODENOSCOPY (EGD) WITH PROPOFOL PERCUTANEOUS ENDOSCOPIC GASTROSTOMY (PEG) PLACEMENT  Patient Location: Endoscopy Unit  Anesthesia Type:MAC  Level of Consciousness: awake, alert , oriented and patient cooperative  Airway & Oxygen Therapy: Patient Spontanous Breathing and Patient connected to nasal cannula oxygen  Post-op Assessment: Report given to RN and Post -op Vital signs reviewed and stable  Post vital signs: Reviewed and stable  Last Vitals:  Vitals Value Taken Time  BP 177/94 04/03/21 1444  Temp 36.5 C 04/03/21 1444  Pulse 91 04/03/21 1444  Resp 15 04/03/21 1444  SpO2 100 % 04/03/21 1444  Vitals shown include unvalidated device data.  Last Pain:  Vitals:   04/03/21 1444  TempSrc: Temporal  PainSc: 0-No pain         Complications: No notable events documented.

## 2021-04-03 NOTE — Progress Notes (Signed)
Called by RN to the bedside for bleeding at PEG site. Bleeding from left side of PEG not controlled with pressure or silver nitrate. Injected 3cc 1% lidocaine with epi at the site. Multiple silk, simple interrupted sutures placed until hemostasis observed. Dressing and abdominal binder placed. Dr. Janee Morn was present for this. Continue abdominal binder. CCS will re-check PEG site tomorrow morning.   Hosie Spangle, PA-C [facility] Please see Amion for pager number during day hours 7:00am-4:30pm

## 2021-04-03 NOTE — Anesthesia Procedure Notes (Signed)
Procedure Name: MAC Date/Time: 04/03/2021 2:24 PM Performed by: Renato Shin, CRNA Pre-anesthesia Checklist: Patient identified, Emergency Drugs available, Suction available and Patient being monitored Patient Re-evaluated:Patient Re-evaluated prior to induction Oxygen Delivery Method: Nasal cannula Preoxygenation: Pre-oxygenation with 100% oxygen Induction Type: IV induction Airway Equipment and Method: Bite block Placement Confirmation: positive ETCO2 and breath sounds checked- equal and bilateral

## 2021-04-03 NOTE — Anesthesia Postprocedure Evaluation (Signed)
Anesthesia Post Note  Patient: Restaurant manager, [employer] food  Procedure(s) Performed: ESOPHAGOGASTRODUODENOSCOPY (EGD) WITH PROPOFOL PERCUTANEOUS ENDOSCOPIC GASTROSTOMY (PEG) PLACEMENT     Patient location during evaluation: PACU Anesthesia Type: MAC Level of consciousness: awake and alert Pain management: pain level controlled Vital Signs Assessment: post-procedure vital signs reviewed and stable Respiratory status: spontaneous breathing and respiratory function stable Cardiovascular status: stable Postop Assessment: no apparent nausea or vomiting Anesthetic complications: no   No notable events documented.  Last Vitals:  Vitals:   04/03/21 1505 04/03/21 1529  BP: (!) 167/96 (!) 179/88  Pulse: 70 75  Resp: 11 15  Temp:  36.8 C  SpO2: 100% 100%    Last Pain:  Vitals:   04/03/21 1529  TempSrc: Oral  PainSc:                  Tanya Nichols

## 2021-04-03 NOTE — Progress Notes (Signed)
Patient left Unit for ENDO.

## 2021-04-03 NOTE — Progress Notes (Signed)
RN called MD to verified rate of TF for PEG. Per Dr. Kirtland Bouchard, to follow TF protocol. TF restart at 48ml/hr, increase 72ml Q4h until reach goal. Report to oncoming RN. PEG site check and verified with oncoming RN, no bleeding noted. Will continue to monitor.

## 2021-04-03 NOTE — Progress Notes (Signed)
Pt has home CPAP at bedside, set up and ready for use. Advised pt to notify for RT if any further assistance is needed.

## 2021-04-03 NOTE — Progress Notes (Signed)
   Subjective:   Tanya Nichols states she is feeling well today. She exercised yesterday and noticed her arms had tingling sensation.   She notes that her tube feeds have been arranged. She spoke with RD regarding blenderized TF versus the pre-made feeds.   She plans to wear the CPAP tonight after cortrak has been removed.  Objective:  Vital signs in last 24 hours: Vitals:   04/02/21 1242 04/02/21 2025 04/03/21 0010 04/03/21 0441  BP: (!) 146/92 (!) 159/88 (!) 150/87 (!) 151/85  Pulse: 80 88 81 88  Resp: 18 18 18 18   Temp: 98.7 F (37.1 C) 98.3 F (36.8 C) 97.9 F (36.6 C) 98.4 F (36.9 C)  TempSrc: Oral Oral Oral Oral  SpO2: 100% 98% 98% 98%  Weight:    90.6 kg  Height:       Physical Exam: General: Obese, middle-aged female, sitting up in bed, NAD. CV: Normal rate and regular rhythm, no murmurs rubs or gallops. Pulm: Normal work of breathing, no respiratory distress noted. Abdomen: Soft, nontender, nondistended, hypoactive bowel sounds. Neuro: AAOx4, no dysarthria or facial drooping noted.   Skin: Warm and dry.  Assessment/Plan:  Active Problems:   CVA (cerebral vascular accident) (HCC)   Acute CVA (cerebrovascular accident) (HCC)  Small acute L pontomedullary CVA Patient scheduled for PEG tube placement today per trauma.  Greatly appreciate trauma assistance.  Patient will likely need to keep PEG tube in place for at least 6 weeks.  If cleared by trauma, patient may be able to leave over the weekend after learning self tube feeds. -Appreciate neurology assistance -Holding Plavix (day 3), will likely be able to restart tomorrow -Continue aspirin and Lipitor for secondary prevention -Perform trial of CPAP for suspected OSA tonight  HTN Blood pressures improved but still moderately elevated.  Will titrate Norvasc dose for better BP control. -Continue Cozaar 100 mg daily per tube -Increased Norvasc to 10 mg daily per tube  Prior to Admission Living Arrangement:  Home Anticipated Discharge Location: Home Barriers to Discharge: Continued medical management Dispo: Anticipated discharge in approximately 1-2 day(s).   , MD Pager: [phone] 04/03/2021, 6:56 AM

## 2021-04-03 NOTE — Plan of Care (Signed)
  Problem: Education: Goal: Knowledge of secondary prevention will improve Outcome: Progressing   Problem: Education: Goal: Knowledge of General Education information will improve Description: Including pain rating scale, medication(s)/side effects and non-pharmacologic comfort measures Outcome: Progressing   Problem: Health Behavior/Discharge Planning: Goal: Ability to manage health-related needs will improve Outcome: Progressing

## 2021-04-03 NOTE — Significant Event (Addendum)
Hypoglycemic Event  CBG: 56  Treatment: D50 25 mL (12.5 gm)  Symptoms: None  Follow-up CBG: Time:1655 CBG Result:160  Possible Reasons for Event: Other: NPO  Comments/MD notified:MD notified by page. Spoke with Dr. Lind Guest, [facility]  Patient with new PEG placement, MD change CBG order from Q8h to Q4h.  Arthor Captain

## 2021-04-03 NOTE — Progress Notes (Signed)
  Speech Language Pathology Treatment: Dysphagia  Patient Details Name: Tanya Nichols MRN: [id number] DOB: 1971/07/22 Today's Date: 04/03/2021 Time: 1884-1660 SLP Time Calculation (min) (ACUTE ONLY): 15 min  Assessment / Plan / Recommendation Clinical Impression  Pt is preparing for PEG this afternoon.  We reviewed yesterday's study with recs to continue exercises and thin liquids as able. Pt has a good understanding of circumstances and recommendations.  We discussed plan to repeat MBS as OP in ~ three weeks.  Answered questions and provided support.    Have requested that attending physician write orders for OP MBS at [facility] in approximately three weeks.   Pt verbalizes understanding.     HPI HPI: 50 yo F w/ no known PMH presenting to [facility] with dysarthria, dysphagia, facial droop, nausea, vomiting. She was in her usual state of health until 03/25/21 at 10am when she developed acute onset vertigo with nausea and vomiting. MRI showed acute infarct at left pontomedullary junction. Pt failed [employer] screen.      SLP Plan  Other (Comment) (OP MBS)       Recommendations  Diet recommendations: Thin liquid Liquids provided via: Cup Medication Administration: Via alternative means Supervision: Patient able to self feed                Oral Care Recommendations: Oral care QID Follow up Recommendations: Outpatient SLP;Other (comment) (OP MBS) SLP Visit Diagnosis: Dysphagia, pharyngoesophageal phase (R13.14) Plan: Other (Comment) (OP MBS)       GO              Tanya Supan L. Samson Frederic, MA CCC/SLP Acute Rehabilitation Services Office number [phone] Pager [phone]  [name] 04/03/2021, 11:37 AM

## 2021-04-03 NOTE — Op Note (Signed)
[facility] Patient Name: Preet Mangano Procedure Date : 04/03/2021 MRN: [id number] Attending MD: Georganna Skeans , MD Date of Birth: 1970/11/09 CSN: [id number] Age: 50 Admit Type: Inpatient Procedure:                Upper GI endoscopy Indications:              Place PEG because patient is unable to eat due to                            stroke (CVA) Providers:                Georganna Skeans, MD, Melina Modena, PA-C, Clyde Lundborg, RN, Fransico Setters Mbumina, Technician Referring MD:              Medicines:                See the Anesthesia note for documentation of the                            administered medications Complications:            No immediate complications. Estimated Blood Loss:     Estimated blood loss was minimal. Procedure:                Pre-Anesthesia Assessment:                           - Prior to the procedure, a History and Physical                            was performed, and patient medications and                            allergies were reviewed. The patient is competent.                            The risks and benefits of the procedure and the                            sedation options and risks were discussed with the                            patient. All questions were answered and informed                            consent was obtained. Patient identification and                            proposed procedure were verified by the physician,                            the nurse, the anesthetist and the technician in  the endoscopy suite. Mental Status Examination:                            normal. ASA Grade Assessment: II - A patient with                            mild systemic disease. After reviewing the risks                            and benefits, the patient was deemed in                            satisfactory condition to undergo the procedure.                            The  anesthesia plan was to use monitored anesthesia                            care (MAC). Immediately prior to administration of                            medications, the patient was re-assessed for                            adequacy to receive sedatives. The heart rate,                            respiratory rate, oxygen saturations, blood                            pressure, adequacy of pulmonary ventilation, and                            response to care were monitored throughout the                            procedure. The physical status of the patient was                            re-assessed after the procedure.                           After obtaining informed consent, the endoscope was                            passed under direct vision. Throughout the                            procedure, the patient's blood pressure, pulse, and                            oxygen saturations were monitored continuously. The  GIF-H190 ([id number]) Olympus gastroscope was                            introduced through the mouth, and advanced to the                            duodenal bulb. The upper GI endoscopy was                            accomplished without difficulty. The patient                            tolerated the procedure well. Scope In: Scope Out: Findings:      No gross lesions were noted in the esophagus.      Multiple less than 5 mm sessile polyps with no bleeding and no stigmata       of recent bleeding were found in the stomach.      The exam of the stomach was otherwise normal.      No gross lesions were noted in the duodenal bulb. Placement of an       externally removable PEG with no T-fasteners was successfully completed.       The external bumper was at the 4.5 cm marking on the tube. Impression:               - No gross lesions in esophagus.                           - Multiple gastric polyps.                           - No specimens  collected. Recommendation:           meds now, tube feeds in 4h Procedure Code(s):        --- Professional ---                           [phone], Esophagogastroduodenoscopy, flexible,                            transoral; diagnostic, including collection of                            specimen(s) by brushing or washing, when performed                            (separate procedure) Diagnosis Code(s):        --- Professional ---                           K31.7, Polyp of stomach and duodenum                           I69.398, Other sequelae of cerebral infarction                           R63.3, Feeding difficulties  Z43.1, Encounter for attention to gastrostomy CPT copyright 2019 American Medical Association. All rights reserved. The codes documented in this report are preliminary and upon coder review may  be revised to meet current compliance requirements. Georganna Skeans, MD 04/03/2021 2:46:29 PM This report has been signed electronically. Number of Addenda: 0

## 2021-04-03 NOTE — Progress Notes (Signed)
STROKE TEAM PROGRESS NOTE   INTERVAL HISTORY Patient walking in her room talking to her speech therapist..  She has no new complaints.  Neurological exam is unchanged.  She is going to have a PEG tube placed today.   PERTINENT IMAGING MRA Neck Poor extracranial carotid arteries are tortuous but no hemodynamically significant stenosis.  Both vertebral arteries are patent  MRA Head Normal intracranial MRA.  2D Echo 17Jun2022 Left ventricular ejection fraction, by estimation, is 60 to 65%. The  left ventricle has normal function. The left ventricle has no regional  wall motion abnormalities. There is moderate asymmetric left ventricular hypertrophy. Left ventricular diastolic parameters were normal.   Right ventricular systolic function is normal. The right ventricular size is normal.  The mitral valve is normal in structure. Trivial mitral valve regurgitation. Moderate mitral annular calcification.  The aortic valve is normal in structure. Aortic valve regurgitation is not visualized. No aortic stenosis is present.   Carotid US 17Jun2022. Right Carotid: The extracranial vessels were near-normal with only minimal  wall thickening or plaque.  Left Carotid: The extracranial vessels were near-normal with only minimal  wall thickening or plaque.  Vertebrals:  Bilateral vertebral arteries demonstrate antegrade flow.  Subclavians: Normal flow hemodynamics were seen in bilateral subclavian            arteries.   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 No mass or abnormal enhancement  CT Head 18Jun2022 Negative noncontrast CT appearance of the brain.  Vitals:   04/03/21 0010 04/03/21 0441 04/03/21 0805 04/03/21 1200  BP: (!) 150/87 (!) 151/85 (!) 146/97 (!) 160/92  Pulse: 81 88 79 79  Resp: 18 18 18 18   Temp: 97.9 F (36.6 C) 98.4 F (36.9 C) 98.1 F (36.7 C) 98.8 F (37.1 C)  TempSrc: Oral  Oral Oral Oral  SpO2: 98% 98% 100% 100%  Weight:  90.6 kg    Height:        CBC:  Recent Labs  Lab 03/28/21 0623  WBC 8.3  HGB 13.3  HCT 41.6  MCV 82.1  PLT 284    Basic Metabolic Panel:  Recent Labs  Lab 03/29/21 0046 03/30/21 0949  NA 141 141  K 3.5 4.1  CL 106 109  CO2 24 26  GLUCOSE 105* 148*  BUN 13 17  CREATININE 0.81 0.86  CALCIUM 8.9 9.4  MG  --  2.2    Lipid Panel:  Recent Labs  Lab 03/28/21 0623  CHOL 158  TRIG 132  HDL 41  CHOLHDL 3.9  VLDL 26  LDLCALC 91   HgbA1c:  Lab Results  Component Value Date   HGBA1C 5.6 03/28/2021   Urine Drug Screen:     Component Value Date/Time   LABOPIA NONE DETECTED 03/28/2021 1637   COCAINSCRNUR NONE DETECTED 03/28/2021 1637   LABBENZ NONE DETECTED 03/28/2021 1637   AMPHETMU NONE DETECTED 03/28/2021 1637   THCU NONE DETECTED 03/28/2021 1637   LABBARB NONE DETECTED 03/28/2021 1637    Alcohol Level     Component Value Date/Time   ETH <10 03/27/2021 0547    EXAMINATION   HEENT-  Normocephalic, no lesions, without obvious abnormality.  Normal external eye and conjunctiva.   Cardiovascular- well perfused Lungs-Breathing comfortably on room air Abdomen- soft Musculoskeletal-no obvious abnormality Skin-warm and dry, intact  Neurologic Exam: General: NAD, pleasant Mental Status: Alert, oriented, thought content appropriate.  Speech fluent without evidence of aphasia.  Able  to follow commands without difficulty. Cranial Nerves: II:  Visual fields grossly normal, pupils equal, round, reactive to light and accommodation III,IV, VI: ptosis not present, extra-ocular motions intact bilaterally but there is saccadic dysmetria on lateral gaze (right. V,VII: smile symmetric, facial light touch sensation normal bilaterally VIII: hearing normal bilaterally IX,X: uvula rises symmetrically XI: bilateral shoulder shrug XII: tongue with slight left-shift on extension, without atrophy or fasciculations. She  appears to have full strength and mobility of the tongue. Motor: Right : Upper extremity   5/5    Left:     Upper extremity   5/5  Lower extremity   5/5     Lower extremity   5/5 Tone and bulk:normal tone throughout; no atrophy noted Sensory: Light touch sensation "different" (not described as reduced) on the left compared to the right. RUE>RLE. Cerebellar: Normal finger-to-nose,no truncal ataxia when sitting Gait: Deferred   ASSESSMENT/PLAN [name] is a 50 y.o. healthy female who presented to [facility]  for evaluation of generalized weakness, right arm tingling, difficulty speaking and difficulty swallowing. She first noticed symptoms on 15Jun with the feeling that her vision was "off", her visual perception of her surroundings being "slanted" diagonally. She then had multiple episodes of N/V, in addition to lightheadedness, swaying vertigo and difficulty walking. Her mouth seemed not to be moving normally when she was talking. On 16Jun she developed additional symptoms of numbness and tingling to her face, arm and leg. She has not had any weakness. She has also had trouble swallowing.  [name] is still experiencing sensory changes in the left arm, as well as trouble swallowing. Additional investigation into her history notes that she has recently begun a trampoline-based exercise program. She recently noted some sharp high-neck pain associated with exercise. She does not have a history of lupus, illicit drug use, miscarriage, OCP use, does not visit a chiropractor.  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's age and previous medical history into consideration makes precise etiology unclear. [name] has mild to moderate  microvascular changes on imaging. CTA head was negative. Carotid US negative. TTE showed moderate asymmetric left ventricular hypertrophy which points to longer-standing uncontrolled hypertension as an etiology for stroke. Additional workup is needed: - CTA neck (ordered) - Labs pending: Cardiolipin antibodies, ANA, UDS, HgbA1c - LDL is 91, goal <70; Continue lipitor 40mg  daily - DAPT: 81mg  ASA + Plavix 75 mg daily into 3 weeks followed by aspirin alone.  40mg  lipitor daily - Permissive hypertension  #DVT ppx Patient is ambulatory, no SCDs - On lovenox 40mg  daily  #HTN [name] does not have a history of HTN but BPs have been 160s-190s/80s-100s  in the hospital. - Trend BPs - Continue Cozaar 50 mg daily - Permissive hypertension OK (< 220/120) but gradually normalize in 5-7 days)  #Dispo Pending above labs and imaging - PT/OT suggests outpatient therapy follow-up - SLP reassessment appreciated - Home when medically appropriate.  Hospital day # 7      She has persistent significant dysphagia and has a core track tube for nutrition.  She will have a PEG tube placed today.  She is participating in the sleep smart study for stroke prevention .   CPAP mask trial will be repeated once core track tube is removed..  Recommend continue  ongoing speech therapy.     Discussed with patient and speech therapist resume Plavix after the PEG tube was placed    Delia Heady, MD Medical Director [facility] Pager: [phone] 04/03/2021 1:12 PM

## 2021-04-03 NOTE — Progress Notes (Addendum)
Report received from ENDO RN, patient resume meds and TF at 1830. TELE reapplied. VSS. Patient denied any distress. PEG site noted bleeding, ENDO RN Edwardia changed dressing at this time. Abd binder ordered.

## 2021-04-04 DIAGNOSIS — E7800 Pure hypercholesterolemia, unspecified: Secondary | ICD-10-CM

## 2021-04-04 DIAGNOSIS — R1314 Dysphagia, pharyngoesophageal phase: Secondary | ICD-10-CM

## 2021-04-04 DIAGNOSIS — E669 Obesity, unspecified: Secondary | ICD-10-CM

## 2021-04-04 DIAGNOSIS — I10 Essential (primary) hypertension: Secondary | ICD-10-CM

## 2021-04-04 LAB — CBC WITH DIFFERENTIAL/PLATELET
Abs Immature Granulocytes: 0.03 10*3/uL (ref 0.00–0.07)
Basophils Absolute: 0.1 10*3/uL (ref 0.0–0.1)
Basophils Relative: 1 %
Eosinophils Absolute: 0.1 10*3/uL (ref 0.0–0.5)
Eosinophils Relative: 2 %
HCT: 38 % (ref 36.0–46.0)
Hemoglobin: 12.3 g/dL (ref 12.0–15.0)
Immature Granulocytes: 0 %
Lymphocytes Relative: 21 %
Lymphs Abs: 1.9 10*3/uL (ref 0.7–4.0)
MCH: 26.3 pg (ref 26.0–34.0)
MCHC: 32.4 g/dL (ref 30.0–36.0)
MCV: 81.4 fL (ref 80.0–100.0)
Monocytes Absolute: 0.6 10*3/uL (ref 0.1–1.0)
Monocytes Relative: 6 %
Neutro Abs: 6.5 10*3/uL (ref 1.7–7.7)
Neutrophils Relative %: 70 %
Platelets: 284 10*3/uL (ref 150–400)
RBC: 4.67 MIL/uL (ref 3.87–5.11)
RDW: 14.5 % (ref 11.5–15.5)
WBC: 9.2 10*3/uL (ref 4.0–10.5)
nRBC: 0 % (ref 0.0–0.2)

## 2021-04-04 LAB — GLUCOSE, CAPILLARY
Glucose-Capillary: 145 mg/dL — ABNORMAL HIGH (ref 70–99)
Glucose-Capillary: 103 mg/dL — ABNORMAL HIGH (ref 70–99)
Glucose-Capillary: 102 mg/dL — ABNORMAL HIGH (ref 70–99)
Glucose-Capillary: 93 mg/dL (ref 70–99)
Glucose-Capillary: 103 mg/dL — ABNORMAL HIGH (ref 70–99)
Glucose-Capillary: 109 mg/dL — ABNORMAL HIGH (ref 70–99)

## 2021-04-04 MED ORDER — CLOPIDOGREL BISULFATE 75 MG PO TABS
75.0000 mg | ORAL_TABLET | Freq: Every day | ORAL | Status: DC
  Administered 2021-04-04: 75 mg via ORAL
  Filled 2021-04-04 (×2): qty 1

## 2021-04-04 NOTE — Progress Notes (Signed)
1 Day Post-Op   Subjective/Chief Complaint: Complains of soreness along left abd   Objective: Vital signs in last 24 hours: Temp:  [97.7 F (36.5 C)-98.8 F (37.1 C)] 98.4 F (36.9 C) (06/25 0404) Pulse Rate:  [70-91] 83 (06/25 0404) Resp:  [11-27] 16 (06/25 0404) BP: (144-195)/(83-98) 144/83 (06/25 0404) SpO2:  [99 %-100 %] 100 % (06/25 0404) Weight:  [90.1 kg] 90.1 kg (06/25 0404) Last BM Date: 03/30/21  Intake/Output from previous day: 06/24 0701 - 06/25 0700 In: 300 [I.V.:300] Out: -  Intake/Output this shift: No intake/output data recorded.  General appearance: alert and cooperative Resp: clear to auscultation bilaterally Cardio: regular rate and rhythm GI: soft, moderate tenderness near peg site. No further bleeding from peg site  Lab Results:  Recent Labs    04/04/21 0313  WBC 9.2  HGB 12.3  HCT 38.0  PLT 284   BMET No results for input(s): NA, K, CL, CO2, GLUCOSE, BUN, CREATININE, CALCIUM in the last 72 hours. PT/INR No results for input(s): LABPROT, INR in the last 72 hours. ABG No results for input(s): PHART, HCO3 in the last 72 hours.  Invalid input(s): PCO2, PO2  Studies/Results: No results found.  Anti-infectives: Anti-infectives (From admission, onward)    None       Assessment/Plan: s/p Procedure(s): ESOPHAGOGASTRODUODENOSCOPY (EGD) WITH PROPOFOL (N/A) PERCUTANEOUS ENDOSCOPIC GASTROSTOMY (PEG) PLACEMENT (N/A) Tube feeds are already at goal Peg site looks good Disposition per medical team Follow up with [place] when peg is no longer needed. Will need to stay in a minimum of 6 weeks  LOS: 8 days    Tanya Nichols 04/04/2021

## 2021-04-04 NOTE — Progress Notes (Signed)
   Subjective:   Tanya Nichols states she is having some abdominal pain today from the PEG placement. She notes that bleeding stopped yesterday after additional stitches were applied. She has been able to successfully use it. Requesting education on proper use.   Objective:  Vital signs in last 24 hours: Vitals:   04/03/21 1529 04/03/21 2303 04/04/21 0404 04/04/21 1159  BP: (!) 179/88 (!) 147/95 (!) 144/83 130/79  Pulse: 75 88 83 90  Resp: 15 16 16 16   Temp: 98.2 F (36.8 C) 98.1 F (36.7 C) 98.4 F (36.9 C) 98.7 F (37.1 C)  TempSrc: Oral Axillary Axillary Oral  SpO2: 100% 99% 100% 100%  Weight:   90.1 kg   Height:       Physical Exam: General: Obese, middle-aged female, sitting up in bed, NAD. Pulm: Normal work of breathing, no respiratory distress noted. Abdomen: Soft. PEG in place in LUQ. No evidence of active bleeding or erythema. Clean and dry.  Neuro: AAOx4, no dysarthria or facial drooping noted.   Skin: Warm and dry.  Assessment/Plan:  Active Problems:   CVA (cerebral vascular accident) (HCC)   Acute CVA (cerebrovascular accident) (HCC)  Small acute L pontomedullary CVA PEG has been successfully placed. Barriers to discharge include education on PEG use, as well as arranging home supplies.   -Appreciate neurology assistance -Restart Plavix today -Continue aspirin and Lipitor for secondary prevention -Continue CPAP use per research study protocol -Discussed with RN to provide patient education PEG use -Reached out to Case Manager regarding tube feed supplies   HTN -Continue Cozaar 100 mg daily per tube -Continue Norvasc to 10 mg daily per tube  Prior to Admission Living Arrangement: Home Anticipated Discharge Location: Home Barriers to Discharge: education on PEG use, as well as arranging home supplies  Dispo: Anticipated discharge in approximately 1-2 day(s).   Dr. Marland Kitchen Internal Medicine PGY-2  Pager: [phone] After 5pm on weekdays and  1pm on weekends: On Call pager [phone]  04/04/2021, 2:29 PM

## 2021-04-04 NOTE — Progress Notes (Signed)
STROKE TEAM PROGRESS NOTE   INTERVAL HISTORY Patient sitting at edge of bed, no family at bedside.  Had PEG tube yesterday, tolerating well.  Plan to start DAPT today for stroke.  PERTINENT IMAGING MRA Neck Poor extracranial carotid arteries are tortuous but no hemodynamically significant stenosis.  Both vertebral arteries are patent  MRA Head Normal intracranial MRA.  2D Echo 17Jun2022 Left ventricular ejection fraction, by estimation, is 60 to 65%. The  left ventricle has normal function. The left ventricle has no regional  wall motion abnormalities. There is moderate asymmetric left ventricular hypertrophy. Left ventricular diastolic parameters were normal.   Right ventricular systolic function is normal. The right ventricular size is normal.  The mitral valve is normal in structure. Trivial mitral valve regurgitation. Moderate mitral annular calcification.  The aortic valve is normal in structure. Aortic valve regurgitation is not visualized. No aortic stenosis is present.   Carotid US 17Jun2022. Right Carotid: The extracranial vessels were near-normal with only minimal  wall thickening or plaque.  Left Carotid: The extracranial vessels were near-normal with only minimal  wall thickening or plaque.  Vertebrals:  Bilateral vertebral arteries demonstrate antegrade flow.  Subclavians: Normal flow hemodynamics were seen in bilateral subclavian            arteries.   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 No mass or abnormal enhancement  CT Head 18Jun2022 Negative noncontrast CT appearance of the brain.  Vitals:   04/03/21 1529 04/03/21 2303 04/04/21 0404 04/04/21 1159  BP: (!) 179/88 (!) 147/95 (!) 144/83 130/79  Pulse: 75 88 83 90  Resp: 15 16 16 16   Temp: 98.2 F (36.8 C) 98.1 F (36.7 C) 98.4 F (36.9 C) 98.7 F (37.1 C)  TempSrc: Oral Axillary Axillary Oral  SpO2: 100% 99%  100% 100%  Weight:   90.1 kg   Height:        CBC:  Recent Labs  Lab 04/04/21 0313  WBC 9.2  NEUTROABS 6.5  HGB 12.3  HCT 38.0  MCV 81.4  PLT 284    Basic Metabolic Panel:  Recent Labs  Lab 03/29/21 0046 03/30/21 0949  NA 141 141  K 3.5 4.1  CL 106 109  CO2 24 26  GLUCOSE 105* 148*  BUN 13 17  CREATININE 0.81 0.86  CALCIUM 8.9 9.4  MG  --  2.2    Lipid Panel:  No results for input(s): CHOL, TRIG, HDL, CHOLHDL, VLDL, LDLCALC in the last 168 hours.  HgbA1c:  Lab Results  Component Value Date   HGBA1C 5.6 03/28/2021   Urine Drug Screen:     Component Value Date/Time   LABOPIA NONE DETECTED 03/28/2021 1637   COCAINSCRNUR NONE DETECTED 03/28/2021 1637   LABBENZ NONE DETECTED 03/28/2021 1637   AMPHETMU NONE DETECTED 03/28/2021 1637   THCU NONE DETECTED 03/28/2021 1637   LABBARB NONE DETECTED 03/28/2021 1637    Alcohol Level     Component Value Date/Time   ETH <10 03/27/2021 0547    EXAMINATION   HEENT-  Normocephalic, no lesions, without obvious abnormality.  Normal external eye and conjunctiva.   Cardiovascular- well perfused Lungs-Breathing comfortably on room air Abdomen- soft Musculoskeletal-no obvious abnormality Skin-warm and dry, intact  Neurologic Exam: General: NAD, pleasant Mental Status: Alert, oriented, thought content appropriate.  Speech fluent without evidence of aphasia.  Able to follow commands without difficulty. Cranial Nerves: II:  Visual fields grossly normal, pupils equal, round,  reactive to light and accommodation III,IV, VI: ptosis not present, extra-ocular motions intact bilaterally but there is saccadic dysmetria on lateral gaze (right. V,VII: smile symmetric, facial light touch sensation normal bilaterally VIII: hearing normal bilaterally IX,X: uvula rises symmetrically XI: bilateral shoulder shrug XII: tongue with slight left-shift on extension, without atrophy or fasciculations. She appears to have full strength and  mobility of the tongue. Motor: Right : Upper extremity   5/5    Left:     Upper extremity   5/5  Lower extremity   5/5     Lower extremity   5/5 Tone and bulk:normal tone throughout; no atrophy noted Sensory: symmetrical b/l on light touch however, still has subjectively tingling in right hand which Cerebellar: Normal finger-to-nose,no truncal ataxia when sitting Gait: Deferred   ASSESSMENT/PLAN [name] is a 50 y.o. healthy female who presented to [facility]  for evaluation of generalized weakness, right arm tingling, difficulty speaking and difficulty swallowing. She first noticed symptoms on 15Jun with the feeling that her vision was "off", her visual perception of her surroundings being "slanted" diagonally. She then had multiple episodes of N/V, in addition to lightheadedness, swaying vertigo and difficulty walking. Her mouth seemed not to be moving normally when she was talking. On 16Jun she developed additional symptoms of numbness and tingling to her face, arm and leg. She has not had any weakness. She has also had trouble swallowing.  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 Lacunar infarcts such as this are typically caused by significant changes in the microvasculature; vascular risk factors including hypertension, hyperlipidemia, obesity and suspected sleep apnea. [name] has mild to moderate microvascular changes on imaging. Carotid US negative. MRA head and neck neg. TTE showed moderate asymmetric left ventricular hypertrophy which consistent with longer-standing uncontrolled hypertension as an etiology for stroke.  - Cardiolipin antibodies, ANA, UDS all neg, HgbA1c 5.6 - LDL is 91, goal <70; Continue lipitor 40mg  daily - DAPT: recommend 81mg  ASA + Plavix 75 mg daily for 3 weeks followed by aspirin alone.  40mg  lipitor daily - Permissive hypertension - On lovenox 40mg  daily for DVT prophylaxis - PT/OT suggests outpatient  therapy follow-up - Home when medically appropriate.  #HTN [name] does not have a history of HTN but BPs have been 160s-190s/80s-100s  in the [facility]. - Trend BPs - Continue Cozaar 50 mg daily - long term BP goal normotensive  #HLD - LDL 91, not at goal - on lipitor 40, continue on discharge.  Dysphagia  S/p PEG Surgery and speech on board Can resume DAPT after surgery  Other stroke risk factors Obesity  Possible sleepy apnea  [facility] day # 8   Neurology will sign off. Please call with questions. Pt will follow up with stroke clinic NP at [facility] in about 4 weeks. Thanks for the consult.  , MD PhD Stroke Neurology 04/04/2021 2:50 PM

## 2021-04-04 NOTE — Progress Notes (Signed)
Pt declined CPAP for tonight. 

## 2021-04-04 NOTE — Progress Notes (Signed)
Occupational Therapy Treatment Patient Details Name: Tanya Nichols MRN: [id number] DOB: 1971-09-14 Today's Date: 04/04/2021    History of present illness 50 y.o. female presents to [facility] ED on 03/26/2021 with reports of nausea, vomiting, RUE numbness/tingling, dysphagia, and dysarthria. MRI demonstrates acute pontomedullary junction infarct. Pt reports insignificant PMH.   OT comments  Tanya Nichols is progressing very well. She reported some concerns with showering post d/c; reviewed compensatory techniques for showering with water temperature and sitting for increased safety, pt verbalized great understanding. Also educated pt of fall precautions and safety in regards to multiple indoor dogs, cooking and driving. Pt has great insight into her deficits, problem solving, and safety awareness. Pt continues to benefit from continued OT acutely to maximize independence prior to dc home.    Follow Up Recommendations  Other (comment) OP PT for vestib & balance   Equipment Recommendations  3 in 1 bedside commode (for shower use)       Precautions / Restrictions Precautions Precautions: None Precaution Comments: decrease in sensation on R side Restrictions Weight Bearing Restrictions: No       Mobility Bed Mobility Overal bed mobility: Independent                  Transfers Overall transfer level: Independent Equipment used: None             General transfer comment: Pt mod I with transfers wtih use of bed rail or external support seldomly    Balance Overall balance assessment: Needs assistance Sitting-balance support: Feet supported;No upper extremity supported Sitting balance-Leahy Scale: Normal     Standing balance support: No upper extremity supported Standing balance-Leahy Scale: Good Standing balance comment: Pt standing / walking entire session with no LOB                           ADL either performed or assessed with clinical judgement   ADL Overall  ADL's : Needs assistance/impaired           Functional mobility during ADLs: Modified independent General ADL Comments: session focused on education of compensatory techniques for temperature regulation and IADLs      Cognition Arousal/Alertness: Awake/alert Behavior During Therapy: WFL for tasks assessed/performed Overall Cognitive Status: Within Functional Limits for tasks assessed                                 General Comments: Pt with great understanding of deficits, recall of safety precautions and problem solving              General Comments no new concerns, pt with some abdominal pain throughout session    Pertinent Vitals/ Pain       Pain Assessment: Faces Faces Pain Scale: Hurts little more Pain Location: abdomen Pain Descriptors / Indicators: Discomfort;Moaning Pain Intervention(s): Monitored during session   Frequency  Min 2X/week        Progress Toward Goals  OT Goals(current goals can now be found in the care plan section)  Progress towards OT goals: Progressing toward goals  Acute Rehab OT Goals Patient Stated Goal: to be able to cook and drive OT Goal Formulation: With patient Time For Goal Achievement: 04/11/21 Potential to Achieve Goals: Good ADL Goals Additional ADL Goal #1: Pt will voice 2 compensations due to decrease in self regulation to tempatures and light touch Additional ADL Goal #2: Pt will tolerate 20 mins of  activity with no signs of LOB with acitivity  Plan Discharge plan remains appropriate    Co-evaluation                 AM-PAC OT "6 Clicks" Daily Activity     Outcome Measure   Help from another person eating meals?: None Help from another person taking care of personal grooming?: None Help from another person toileting, which includes using toliet, bedpan, or urinal?: None Help from another person bathing (including washing, rinsing, drying)?: None Help from another person to put on and taking  off regular upper body clothing?: None Help from another person to put on and taking off regular lower body clothing?: None 6 Click Score: 24    End of Session    OT Visit Diagnosis: Unsteadiness on feet (R26.81)   Activity Tolerance Patient tolerated treatment well   Patient Left in bed;with call bell/phone within reach   Nurse Communication Mobility status        Time: 1635-1650 OT Time Calculation (min): 15 min  Charges: OT General Charges $OT Visit: 1 Visit OT Treatments $Self Care/Home Management : 8-22 mins     Terilynn Buresh A Aneliese Beaudry 04/04/2021, 5:15 PM

## 2021-04-04 NOTE — TOC Progression Note (Signed)
Transition of Care [facility]) - Progression Note    Patient Details  Name: Tarahji Ramthun MRN: [id number] Date of Birth: 1971-06-07  Transition of Care [facility]) CM/SW Contact  Bess Kinds, RN Phone Number: [phone] 04/04/2021, 4:13 PM  Clinical Narrative:     Sherron Monday with Ian Malkin at [employer]. 3 days of tube feedings and supplies were delivered to patient's room yesterday. [employer] to ship additional supplies to the home next week.   Expected Discharge Plan: Home/Self Care    Expected Discharge Plan and Services Expected Discharge Plan: Home/Self Care   Discharge Planning Services: CM Consult   Living arrangements for the past 2 months: Single Family Home                                       Social Determinants of Health (SDOH) Interventions    Readmission Risk Interventions No flowsheet data found.

## 2021-04-05 LAB — GLUCOSE, CAPILLARY
Glucose-Capillary: 141 mg/dL — ABNORMAL HIGH (ref 70–99)
Glucose-Capillary: 145 mg/dL — ABNORMAL HIGH (ref 70–99)
Glucose-Capillary: 94 mg/dL (ref 70–99)

## 2021-04-05 MED ORDER — AMLODIPINE BESYLATE 10 MG PO TABS: 10.0000 mg | ORAL_TABLET | Freq: Every day | ORAL | 1 refills | Status: AC

## 2021-04-05 MED ORDER — ATORVASTATIN CALCIUM 40 MG PO TABS: 40.0000 mg | ORAL_TABLET | Freq: Every day | ORAL | 1 refills | Status: AC

## 2021-04-05 MED ORDER — ASPIRIN 81 MG PO CHEW
81.0000 mg | CHEWABLE_TABLET | Freq: Every day | ORAL | 1 refills | Status: AC
Start: 2021-04-06 — End: ?

## 2021-04-05 MED ORDER — CLOPIDOGREL BISULFATE 75 MG PO TABS: 75.0000 mg | ORAL_TABLET | Freq: Every day | ORAL | 0 refills | Status: AC

## 2021-04-05 MED ORDER — CLOPIDOGREL BISULFATE 75 MG PO TABS: 75.0000 mg | ORAL_TABLET | Freq: Every day | ORAL | Status: DC

## 2021-04-05 MED ORDER — SENNOSIDES-DOCUSATE SODIUM 8.6-50 MG PO TABS
1.0000 | ORAL_TABLET | Freq: Once | ORAL | Status: AC
  Administered 2021-04-05: 1
  Filled 2021-04-05: qty 1

## 2021-04-05 MED ORDER — OLMESARTAN MEDOXOMIL 40 MG PO TABS: 40.0000 mg | ORAL_TABLET | Freq: Every day | ORAL | 1 refills | Status: AC

## 2021-04-05 MED ORDER — POLYETHYLENE GLYCOL 3350 17 G PO PACK: 17.0000 g | PACK | Freq: Every day | ORAL | Status: DC

## 2021-04-05 MED ORDER — CLOPIDOGREL BISULFATE 75 MG PO TABS
75.0000 mg | ORAL_TABLET | Freq: Every day | ORAL | Status: DC
  Administered 2021-04-05: 75 mg
  Filled 2021-04-05: qty 1

## 2021-04-05 NOTE — Progress Notes (Signed)
Patient education on tube feedings complete. Patient gave herself her medications and her tube feed with minimal assist from RN

## 2021-04-05 NOTE — Progress Notes (Signed)
Tanya Nichols to be D/C'd Home per MD order.  Discussed with the patient and all questions fully answered.   VSS, Skin clean, dry and intact without evidence of skin break down, no evidence of skin tears noted. IV catheter discontinued intact. Site without signs and symptoms of complications. Dressing and pressure applied.   An After Visit Summary was printed and given to the patient.    D/C education completed with patient/family including follow up instructions, medication list, d/c activities limitations if indicated, with other d/c instructions as indicated by MD - patient able to verbalize understanding, all questions fully answered.    Patient instructed to return to ED, call 911, or call MD for any changes in condition.    Patient escorted via WC, and D/C home via Private car with father.

## 2021-04-05 NOTE — TOC Transition Note (Signed)
Transition of Care [facility]) - CM/SW Discharge Note   Patient Details  Name: Tanya Nichols MRN: [id number] Date of Birth: 1971-04-06  [facility] CM/SW Contact:  Bess Kinds, RN Phone Number: [phone] 04/05/2021, 11:57 AM   Clinical Narrative:     Notified by MD of patient transition home today. Previous CM already arranged outpatient rehab and enteral supplies. Nursing providing teaching prior to discharge. No further TOC needs identified at this time.   Final next level of care: Home/Self Care Barriers to Discharge: No Barriers Identified   Patient Goals and CMS Choice Patient states their goals for this hospitalization and ongoing recovery are:: to return to home CMS Medicare.gov Compare Post Acute Care list provided to:: Patient    Discharge Placement                       Discharge Plan and Services   Discharge Planning Services: CM Consult                                 Social Determinants of Health (SDOH) Interventions     Readmission Risk Interventions No flowsheet data found.

## 2021-04-05 NOTE — Discharge Summary (Signed)
Name: Tanya Nichols MRN: [id number] DOB: 16-Aug-1971 50 y.o. PCP: Default, Provider, MD  Date of Admission: 03/26/2021  3:40 PM Date of Discharge: 04/05/2021 Attending Physician: Axel Filler, *  Discharge Diagnosis: 1. Acute left pontomedullary junction CVA 2. Dysphagia 2/2 above 3. Hypertension  Discharge Medications: Allergies as of 04/05/2021   Not on File      Medication List     TAKE these medications    amLODipine 10 MG tablet Commonly known as: NORVASC Place 1 tablet (10 mg total) into feeding tube daily. Start taking on: April 06, 2021   aspirin 81 MG chewable tablet Place 1 tablet (81 mg total) into feeding tube daily. Start taking on: April 06, 2021   atorvastatin 40 MG tablet Commonly known as: LIPITOR Place 1 tablet (40 mg total) into feeding tube daily. Start taking on: April 06, 2021   clopidogrel 75 MG tablet Commonly known as: PLAVIX Place 1 tablet (75 mg total) into feeding tube daily.   olmesartan 40 MG tablet Commonly known as: BENICAR Place 1 tablet (40 mg total) into feeding tube daily.               Durable Medical Equipment  (From admission, onward)           Start     Ordered   04/05/21 1136  DME 3-in-1  Once        04/05/21 1142   04/02/21 1400  For home use only DME Tube feeding  Once       Comments: After PEG is placed, recommend change to bolus feedings:  Jevity 1.2 1.5 containers TID and 2 containers once daily for a total of 6.5 containers per day.  Bolus TF will provide: 1853 kcal, 86 gm protein, 1242 ml free water daily.  Free water flushes 75 ml before and 75 ml after each bolus feeding for an additional 600 ml free water per day.   NUTRITION DIAGNOSIS:    Inadequate oral intake related to dysphagia as evidenced by NPO status.   Ongoing   GOAL:    Patient will meet greater than or equal to 90% of their needs   Met with TF  Anticipated long term need   04/02/21 1400             Disposition and follow-up:   Tanya Nichols was discharged from [facility] in Stable condition.  At the hospital follow up visit please address:  1. Acute left pontomedullary junction CVA. Has residual right-sided paresthesias, gradually improving. Also has residual dysphagia (see below). Discharging with DAPT x3 weeks, then ASA alone. Also discharging with lipitor 11m daily. Will receive outpatient SLP and PT (vestibular/balance). F/u with [facility] in 1 month.  2. Dysphagia 2/2 above.  Residual deficit from stroke.  Gradually improving but required PEG tube placement for long-term nutrition.  Status post PEG tube placement (04/03/2021).  Will follow-up with surgery in about 6 weeks for possible PEG tube removal.  Follow-up with outpatient SLP for further speech therapy.  3. Hypertension.  Not on any medications prior to this admission.  Blood pressures more at goal with Cozaar 100 mg daily and Norvasc 10 mg daily.  [place] can adjust medications as necessary.  2.  Labs / imaging needed at time of follow-up: BMP  3.  Pending labs/ test needing follow-up: NONE  Follow-up Appointments:  [facility]. Schedule an appointment as soon as possible for a visit.   Contact information: [address] [place], [place] [address]   phone [phone]        [facility]. Schedule an appointment as soon as possible for a visit in 1 month(s).   Specialty: Neurology Why: stroke clinic Contact information: [address] [place] [phone]        Georganna Skeans, MD. Schedule an appointment as soon as possible for a visit in 6 week(s).   Specialty: General Surgery Contact information: [address] [place] [address] [phone]                  Hospital Course by problem list: 1. Acute left pontomedullary junction CVA.  Patient presented with right arm paresthesias, left facial droop, and swallowing difficulty.  Noncontrast CT head was negative, but patient found to have small acute left pontomedullary junction CVA on MRI brain.  Followed stroke protocol, started patient on DAPT and Lipitor as her LDL was above goal.  Echo showed LVEF of 60 to 65% and moderate asymmetric left ventricular hypertrophy, but was otherwise normal.  MRA head and neck and carotid Dopplers were negative.  She continued having some residual deficits of right-sided paresthesias (gradually improved throughout admission although did not completely resolve).  Of note, patient's hospital stay was significantly limited due to profound dysphagia which persisted and ultimately required PEG tube placement (see below). Discharging with DAPT x3 weeks, then ASA alone. Also discharging with lipitor 24m daily. Will receive outpatient SLP and PT (vestibular/balance). F/u with [facility] in 1 month.  2. Dysphagia 2/2 above.  Residual deficit from stroke.  Modified barium swallow showing severe aspiration risk initially but patient did have some recovery with repeat MBS showing mild aspiration risk and some swallowing ability.  NG tube was placed for tube feeds and medications initially, but this was later switched to cortrak for comfort per patient preference.  Due to concern for long-term nutrition, PEG tube placement was recommended.  She is status post PEG tube placement (04/03/2021).  Will follow-up with surgery in about 6 weeks for possible PEG tube removal.  She will be seeing outpatient SLP for further speech therapy.  3. Hypertension.  Not on any medications prior to this admission.  Blood pressures were severely elevated.  Initially allowed for permissive hypertension in the setting of stroke but gradually normalized over the following 5 to 7 days with  addition of Cozaar and Norvasc.  Blood pressures are fairly well controlled with Cozaar 100 mg daily and Norvasc 10 mg daily.  [place] can adjust medications as necessary.   Subjective:   Tanya Nichols feeling good this morning.  Her PEG tube site feels a little better, still has intermittent pain but she states it is bearable.  She is ready to go home today.  She has received education on PEG tube feeding.  Discharge Exam:   BP 137/82 (BP Location: Left Arm)   Pulse 96   Temp 99.3 F (37.4 C) (Oral)   Resp 16   Ht 5' 4"  (1.626 m)   Wt 90.8 kg   LMP 03/29/2021 (Exact Date)   SpO2 100%   BMI 34.35 kg/m  Discharge exam:  General: Obese middle-aged female, sitting up in bed, NAD. Pulm: Normal work of breathing, no respiratory  distress noted. CV: Normal rate and regular rhythm, no murmurs rubs or gallops. Abdomen: Soft, PEG tube in place in left upper quadrant with no evidence of active bleeding or erythema.  PEG tube is clean and dry. Neuro: AAOx4, no dysarthria or facial drooping noted.  Still has right upper extremity and right lower extremity paresthesias, but improved since admission. Skin: Warm and dry.  Pertinent Labs, Studies, and Procedures:  CBC Latest Ref Rng & Units 04/04/2021 03/28/2021 03/27/2021  WBC 4.0 - 10.5 K/uL 9.2 8.3 -  Hemoglobin 12.0 - 15.0 g/dL 12.3 13.3 15.6(H)  Hematocrit 36.0 - 46.0 % 38.0 41.6 46.0  Platelets 150 - 400 K/uL 284 284 -   CMP Latest Ref Rng & Units 03/30/2021 03/29/2021 03/28/2021  Glucose 70 - 99 mg/dL 148(H) 105(H) 85  BUN 6 - 20 mg/dL 17 13 17   Creatinine 0.44 - 1.00 mg/dL 0.86 0.81 0.82  Sodium 135 - 145 mmol/L 141 141 140  Potassium 3.5 - 5.1 mmol/L 4.1 3.5 3.3(L)  Chloride 98 - 111 mmol/L 109 106 107  CO2 22 - 32 mmol/L 26 24 24   Calcium 8.9 - 10.3 mg/dL 9.4 8.9 8.7(L)  Total Protein 6.5 - 8.1 g/dL - - -  Total Bilirubin 0.3 - 1.2  mg/dL - - -  Alkaline Phos 38 - 126 U/L - - -  AST 15 - 41 U/L - - -  ALT 0 - 44 U/L - - -   Urinalysis    Component Value Date/Time   COLORURINE AMBER (A) 03/27/2021 0902   APPEARANCEUR HAZY (A) 03/27/2021 0902   LABSPEC 1.030 03/27/2021 0902   PHURINE 6.0 03/27/2021 0902   GLUCOSEU 50 (A) 03/27/2021 0902   HGBUR NEGATIVE 03/27/2021 0902   BILIRUBINUR NEGATIVE 03/27/2021 0902   KETONESUR 20 (A) 03/27/2021 0902   PROTEINUR NEGATIVE 03/27/2021 0902   NITRITE NEGATIVE 03/27/2021 0902   LEUKOCYTESUR NEGATIVE 03/27/2021 0902    Lab Results  Component Value Date   TSH 1.073 03/27/2021   HGBA1C 5.6 03/28/2021   Lipid Panel     Component Value Date/Time   CHOL 158 03/28/2021 0623   TRIG 132 03/28/2021 0623   HDL 41 03/28/2021 0623   CHOLHDL 3.9 03/28/2021 0623   VLDL 26 03/28/2021 0623   LDLCALC 91 03/28/2021 0623     Discharge Instructions: Discharge Instructions     Ambulatory referral to Neurology   Complete by: As directed    Follow up with stroke clinic NP (Jessica Vanschaick or Cecille Rubin, if both not available, consider Zachery Dauer, or Ahern) at [facility] in about 4 weeks. Thanks.   Discharge instructions   Complete by: As directed    Tanya Nichols, is a pleasure taking care of you during your time here.  You came in with a stroke and were treated for it while you were here.  Please take note of the following: 1.  Please make sure to take your aspirin and Plavix daily.  I have prescribed 16 days of Plavix and once you finish that you do not need to take Plavix anymore.  However continue taking your aspirin daily.  2.  Please take Lipitor daily to help lower your cholesterol.  3.  Please take Norvasc and Benicar daily as prescribed to help keep your blood pressures well controlled.  4.  Please make sure to follow-up with your primary care doctor so that they can adjust your medications as necessary.  5.  Please make sure to go see [facility] in 1 month for further evaluation of your stroke.  6.  Please make an appointment with surgery (Dr. Georganna Skeans) in 6 weeks for possible PEG tube removal.  7.  Please work with outpatient physical therapy and speech therapy at [facility].   Increase activity slowly   Complete by: As directed        Signed: Virl Axe, MD 04/05/2021, 11:43 AM   Pager: [phone]

## 2021-04-06 ENCOUNTER — Encounter (HOSPITAL_COMMUNITY): Payer: Self-pay | Admitting: General Surgery

## 2021-05-28 ENCOUNTER — Other Ambulatory Visit: Payer: Self-pay | Admitting: Student
